# Patient Record
Sex: Female | Born: 1950
Health system: Southern US, Community
[De-identification: ages and names within clinical notes are randomized; demographics above are authoritative.]

## PROBLEM LIST (undated history)

## (undated) DIAGNOSIS — IMO0002 Reserved for concepts with insufficient information to code with codable children: Secondary | ICD-10-CM

## (undated) DIAGNOSIS — K5720 Diverticulitis of large intestine with perforation and abscess without bleeding: Secondary | ICD-10-CM

## (undated) HISTORY — PX: ELBOW SURGERY: SHX618

## (undated) HISTORY — PX: FOOT SURGERY: SHX648

## (undated) HISTORY — DX: Reserved for concepts with insufficient information to code with codable children: IMO0002

## (undated) HISTORY — PX: KNEE SURGERY: SHX244

## (undated) HISTORY — PX: APPENDECTOMY: SHX54

---

## 1999-05-18 ENCOUNTER — Other Ambulatory Visit: Admission: RE | Admit: 1999-05-18 | Discharge: 1999-05-18 | Payer: Self-pay | Admitting: Gynecology

## 1999-06-10 ENCOUNTER — Emergency Department (HOSPITAL_COMMUNITY): Admission: EM | Admit: 1999-06-10 | Discharge: 1999-06-10 | Payer: Self-pay

## 1999-06-10 ENCOUNTER — Encounter: Payer: Self-pay | Admitting: Surgery

## 2000-04-03 ENCOUNTER — Encounter: Admission: RE | Admit: 2000-04-03 | Discharge: 2000-04-03 | Payer: Self-pay | Admitting: Gynecology

## 2000-04-03 ENCOUNTER — Encounter: Payer: Self-pay | Admitting: Gynecology

## 2000-05-03 ENCOUNTER — Other Ambulatory Visit: Admission: RE | Admit: 2000-05-03 | Discharge: 2000-05-03 | Payer: Self-pay | Admitting: Gynecology

## 2001-04-10 ENCOUNTER — Other Ambulatory Visit: Admission: RE | Admit: 2001-04-10 | Discharge: 2001-04-10 | Payer: Self-pay | Admitting: Gynecology

## 2002-04-25 ENCOUNTER — Other Ambulatory Visit: Admission: RE | Admit: 2002-04-25 | Discharge: 2002-04-25 | Payer: Self-pay | Admitting: Gynecology

## 2003-02-12 ENCOUNTER — Encounter: Payer: Self-pay | Admitting: Gynecology

## 2003-02-12 ENCOUNTER — Ambulatory Visit (HOSPITAL_COMMUNITY): Admission: RE | Admit: 2003-02-12 | Discharge: 2003-02-12 | Payer: Self-pay | Admitting: Gynecology

## 2003-04-29 ENCOUNTER — Other Ambulatory Visit: Admission: RE | Admit: 2003-04-29 | Discharge: 2003-04-29 | Payer: Self-pay | Admitting: Gynecology

## 2003-10-22 ENCOUNTER — Ambulatory Visit (HOSPITAL_COMMUNITY): Admission: RE | Admit: 2003-10-22 | Discharge: 2003-10-22 | Payer: Self-pay | Admitting: Plastic Surgery

## 2003-10-22 ENCOUNTER — Ambulatory Visit (HOSPITAL_BASED_OUTPATIENT_CLINIC_OR_DEPARTMENT_OTHER): Admission: RE | Admit: 2003-10-22 | Discharge: 2003-10-22 | Payer: Self-pay | Admitting: Plastic Surgery

## 2003-10-22 ENCOUNTER — Encounter (INDEPENDENT_AMBULATORY_CARE_PROVIDER_SITE_OTHER): Payer: Self-pay | Admitting: Specialist

## 2004-06-22 ENCOUNTER — Other Ambulatory Visit: Admission: RE | Admit: 2004-06-22 | Discharge: 2004-06-22 | Payer: Self-pay | Admitting: Gynecology

## 2005-07-19 ENCOUNTER — Other Ambulatory Visit: Admission: RE | Admit: 2005-07-19 | Discharge: 2005-07-19 | Payer: Self-pay | Admitting: Gynecology

## 2006-06-08 ENCOUNTER — Encounter: Admission: RE | Admit: 2006-06-08 | Discharge: 2006-06-08 | Payer: Self-pay | Admitting: Otolaryngology

## 2006-07-19 ENCOUNTER — Ambulatory Visit (HOSPITAL_COMMUNITY): Admission: RE | Admit: 2006-07-19 | Discharge: 2006-07-19 | Payer: Self-pay | Admitting: Gynecology

## 2006-07-26 ENCOUNTER — Other Ambulatory Visit: Admission: RE | Admit: 2006-07-26 | Discharge: 2006-07-26 | Payer: Self-pay | Admitting: Gynecology

## 2007-07-26 ENCOUNTER — Ambulatory Visit (HOSPITAL_COMMUNITY): Admission: RE | Admit: 2007-07-26 | Discharge: 2007-07-26 | Payer: Self-pay | Admitting: Gynecology

## 2008-07-29 ENCOUNTER — Ambulatory Visit (HOSPITAL_COMMUNITY): Admission: RE | Admit: 2008-07-29 | Discharge: 2008-07-29 | Payer: Self-pay | Admitting: Gynecology

## 2009-07-30 ENCOUNTER — Encounter: Admission: RE | Admit: 2009-07-30 | Discharge: 2009-07-30 | Payer: Self-pay | Admitting: Gynecology

## 2011-02-01 ENCOUNTER — Other Ambulatory Visit (HOSPITAL_COMMUNITY): Payer: Self-pay | Admitting: Gynecology

## 2011-02-01 DIAGNOSIS — Z1231 Encounter for screening mammogram for malignant neoplasm of breast: Secondary | ICD-10-CM

## 2011-02-02 ENCOUNTER — Other Ambulatory Visit: Payer: Self-pay | Admitting: Gynecology

## 2011-02-02 ENCOUNTER — Ambulatory Visit (HOSPITAL_COMMUNITY): Payer: Self-pay

## 2011-02-04 ENCOUNTER — Ambulatory Visit (HOSPITAL_COMMUNITY)
Admission: RE | Admit: 2011-02-04 | Discharge: 2011-02-04 | Disposition: A | Discharge status: Home / Self Care | Payer: BC Managed Care – PPO | Source: Ambulatory Visit | Attending: Gynecology | Admitting: Gynecology

## 2011-02-04 DIAGNOSIS — Z1231 Encounter for screening mammogram for malignant neoplasm of breast: Secondary | ICD-10-CM | POA: Insufficient documentation

## 2012-03-26 ENCOUNTER — Other Ambulatory Visit (HOSPITAL_COMMUNITY): Payer: Self-pay | Admitting: Gynecology

## 2012-03-26 DIAGNOSIS — Z1231 Encounter for screening mammogram for malignant neoplasm of breast: Secondary | ICD-10-CM

## 2012-04-02 ENCOUNTER — Other Ambulatory Visit: Payer: Self-pay | Admitting: Gynecology

## 2012-04-16 ENCOUNTER — Ambulatory Visit (HOSPITAL_COMMUNITY)
Admission: RE | Admit: 2012-04-16 | Discharge: 2012-04-16 | Disposition: A | Discharge status: Home / Self Care | Payer: BC Managed Care – PPO | Source: Ambulatory Visit | Attending: Gynecology | Admitting: Gynecology

## 2012-04-16 DIAGNOSIS — Z1231 Encounter for screening mammogram for malignant neoplasm of breast: Secondary | ICD-10-CM | POA: Insufficient documentation

## 2013-01-25 ENCOUNTER — Other Ambulatory Visit: Payer: Self-pay | Admitting: Neurosurgery

## 2013-01-25 DIAGNOSIS — M5412 Radiculopathy, cervical region: Secondary | ICD-10-CM

## 2013-01-25 DIAGNOSIS — M5030 Other cervical disc degeneration, unspecified cervical region: Secondary | ICD-10-CM

## 2013-01-25 DIAGNOSIS — M4802 Spinal stenosis, cervical region: Secondary | ICD-10-CM

## 2013-01-31 ENCOUNTER — Ambulatory Visit
Admission: RE | Admit: 2013-01-31 | Discharge: 2013-01-31 | Disposition: A | Discharge status: Home / Self Care | Payer: BC Managed Care – PPO | Source: Ambulatory Visit | Attending: Neurosurgery | Admitting: Neurosurgery

## 2013-01-31 DIAGNOSIS — M4802 Spinal stenosis, cervical region: Secondary | ICD-10-CM

## 2013-01-31 DIAGNOSIS — M5412 Radiculopathy, cervical region: Secondary | ICD-10-CM

## 2013-01-31 DIAGNOSIS — M5030 Other cervical disc degeneration, unspecified cervical region: Secondary | ICD-10-CM

## 2013-05-14 ENCOUNTER — Ambulatory Visit: Payer: Self-pay | Admitting: Gynecology

## 2013-05-17 ENCOUNTER — Ambulatory Visit: Payer: Self-pay | Admitting: Gynecology

## 2013-05-30 ENCOUNTER — Encounter: Payer: Self-pay | Admitting: Gynecology

## 2013-05-30 ENCOUNTER — Other Ambulatory Visit (HOSPITAL_COMMUNITY)
Admission: RE | Admit: 2013-05-30 | Discharge: 2013-05-30 | Disposition: A | Discharge status: Home / Self Care | Payer: BC Managed Care – PPO | Source: Ambulatory Visit | Attending: Gynecology | Admitting: Gynecology

## 2013-05-30 ENCOUNTER — Ambulatory Visit (INDEPENDENT_AMBULATORY_CARE_PROVIDER_SITE_OTHER): Payer: BC Managed Care – PPO | Admitting: Gynecology

## 2013-05-30 VITALS — BP 130/88 | Ht 66.5 in | Wt 150.0 lb

## 2013-05-30 DIAGNOSIS — Z1159 Encounter for screening for other viral diseases: Secondary | ICD-10-CM

## 2013-05-30 DIAGNOSIS — Z780 Asymptomatic menopausal state: Secondary | ICD-10-CM | POA: Insufficient documentation

## 2013-05-30 DIAGNOSIS — Z1151 Encounter for screening for human papillomavirus (HPV): Secondary | ICD-10-CM | POA: Insufficient documentation

## 2013-05-30 DIAGNOSIS — Z01419 Encounter for gynecological examination (general) (routine) without abnormal findings: Secondary | ICD-10-CM

## 2013-05-30 DIAGNOSIS — R635 Abnormal weight gain: Secondary | ICD-10-CM

## 2013-05-30 DIAGNOSIS — N951 Menopausal and female climacteric states: Secondary | ICD-10-CM

## 2013-05-30 DIAGNOSIS — M2550 Pain in unspecified joint: Secondary | ICD-10-CM | POA: Insufficient documentation

## 2013-05-30 DIAGNOSIS — Z8639 Personal history of other endocrine, nutritional and metabolic disease: Secondary | ICD-10-CM | POA: Insufficient documentation

## 2013-05-30 LAB — LIPID PANEL
LDL Cholesterol: 116 mg/dL — ABNORMAL HIGH (ref 0–99)
Total CHOL/HDL Ratio: 2.8 Ratio
Triglycerides: 80 mg/dL (ref <150)

## 2013-05-30 LAB — CBC WITH DIFFERENTIAL/PLATELET
HCT: 41.8 % (ref 36.0–46.0)
Lymphocytes Relative: 28 % (ref 12–46)
MCH: 30 pg (ref 26.0–34.0)
MCV: 87.1 fL (ref 78.0–100.0)
Monocytes Absolute: 0.4 10*3/uL (ref 0.1–1.0)
Monocytes Relative: 7 % (ref 3–12)
Neutro Abs: 3.2 10*3/uL (ref 1.7–7.7)
Platelets: 305 10*3/uL (ref 150–400)

## 2013-05-30 LAB — COMPREHENSIVE METABOLIC PANEL
ALT: 15 U/L (ref 0–35)
Albumin: 4.7 g/dL (ref 3.5–5.2)
BUN: 17 mg/dL (ref 6–23)
Calcium: 9.7 mg/dL (ref 8.4–10.5)
Creat: 0.93 mg/dL (ref 0.50–1.10)
Glucose, Bld: 95 mg/dL (ref 70–99)
Total Bilirubin: 0.7 mg/dL (ref 0.3–1.2)
Total Protein: 7.2 g/dL (ref 6.0–8.3)

## 2013-05-30 LAB — HEPATITIS C ANTIBODY: HCV Ab: NEGATIVE

## 2013-05-30 NOTE — Progress Notes (Signed)
Katherine Hopkins November 24, 1950 [id number]   History:    62 y.o.  for annual gyn exam new patient to the practice who was previously being followed by Dr. Nicholas Lose. Patient's only complaint has been joint pains on and off. She does have strong family history of rheumatoid arthritis. Patient denies any prior history of abnormal Pap smears. Her last mammogram was normal July 2013 although dense breast. She has had history of vitamin D deficiency in the past. Her last bone density study was in 2013 and was normal. Her Tdap vaccine is up-to-date. She has not received shingles vaccine. Patient with history of colon polyps several years ago her last colonoscopy was in 2006. She is on no hormone replacement therapy.  Past medical history,surgical history, family history and social history were all reviewed and documented in the EPIC chart.  Gynecologic History No LMP recorded. Patient is postmenopausal. Contraception: post menopausal status Last Pap: 2013. Results were: normal Last mammogram: 2013. Results were: normal but dense  Obstetric History OB History  Gravida Para Term Preterm AB SAB TAB Ectopic Multiple Living  2 2        2     # Outcome Date GA Lbr Len/2nd Weight Sex Delivery Anes PTL Lv  2 PAR           1 PAR                ROS: A ROS was performed and pertinent positives and negatives are included in the history.  GENERAL: No fevers or chills. HEENT: No change in vision, no earache, sore throat or sinus congestion. NECK: No pain or stiffness. CARDIOVASCULAR: No chest pain or pressure. No palpitations. PULMONARY: No shortness of breath, cough or wheeze. GASTROINTESTINAL: No abdominal pain, nausea, vomiting or diarrhea, melena or bright red blood per rectum. GENITOURINARY: No urinary frequency, urgency, hesitancy or dysuria. MUSCULOSKELETAL: No joint or muscle pain, no back pain, no recent trauma. DERMATOLOGIC: No rash, no itching, no lesions. ENDOCRINE: No polyuria, polydipsia, no heat or cold  intolerance. No recent change in weight. HEMATOLOGICAL: No anemia or easy bruising or bleeding. NEUROLOGIC: No headache, seizures, numbness, tingling or weakness. PSYCHIATRIC: No depression, no loss of interest in normal activity or change in sleep pattern.     Exam: chaperone present  BP 130/88  Ht 5' 6.5" (1.689 m)  Wt 150 lb (68.04 kg)  BMI 23.85 kg/m2  Body mass index is 23.85 kg/(m^2).  General appearance : Well developed well nourished female. No acute distress HEENT: Neck supple, trachea midline, no carotid bruits, no thyroidmegaly Lungs: Clear to auscultation, no rhonchi or wheezes, or rib retractions  Heart: Regular rate and rhythm, no murmurs or gallops Breast:Examined in sitting and supine position were symmetrical in appearance, no palpable masses or tenderness,  no skin retraction, no nipple inversion, no nipple discharge, no skin discoloration, no axillary or supraclavicular lymphadenopathy Abdomen: no palpable masses or tenderness, no rebound or guarding Extremities: no edema or skin discoloration or tenderness  Pelvic:  Bartholin, Urethra, Skene Glands: Within normal limits             Vagina: No gross lesions or discharge, atrophic changes  Cervix: No gross lesions or discharge  Uterus  anteverted, normal size, shape and consistency, non-tender and mobile  Adnexa  Without masses or tenderness  Anus and perineum  normal   Rectovaginal  normal sphincter tone without palpated masses or tenderness             Hemoccult colonoscopy this  year     Assessment/Plan:  62 y.o. female for annual exam who was scheduled for colonoscopy the next 2 weeks. She was reminded of the importance of monthly breast exam. Because of her joint pains and family history of rheumatoid arthritis she will have a C-reactive protein drawn today along with her comprehensive metabolic panel, TSH, fasting lipid profile, CBC and vitamin D level. Pap smear was done today. Prescription for shingles  Vaccine was provided. Urinalysis is ordered. She will need a bone density study next year.  New CDC guidelines is recommending patients be tested once in her lifetime for hepatitis C antibody who were born between [birth year] through 1963. This was discussed with the patient today and has agreed to be tested today.  Patient reminded to schedule her mammogram but to request a 3-D due to her dense breast.    Ok Edwards MD, 9:17 AM 05/30/2013

## 2013-05-30 NOTE — Patient Instructions (Signed)
Shingles Vaccine What You Need to Know WHAT IS SHINGLES?  Shingles is a painful skin rash, often with blisters. It is also called Herpes Zoster or just Zoster.  A shingles rash usually appears on one side of the face or body and lasts from 2 to 4 weeks. Its main symptom is pain, which can be quite severe. Other symptoms of shingles can include fever, headache, chills, and upset stomach. Very rarely, a shingles infection can lead to pneumonia, hearing problems, blindness, brain inflammation (encephalitis), or death.  For about 1 person in 5, severe pain can continue even after the rash clears up. This is called post-herpetic neuralgia.  Shingles is caused by the Varicella Zoster virus. This is the same virus that causes chickenpox. Only someone who has had a case of chickenpox or rarely, has gotten chickenpox vaccine, can get shingles. The virus stays in your body. It can reappear many years later to cause a case of shingles.  You cannot catch shingles from another person with shingles. However, a person who has never had chickenpox (or chickenpox vaccine) could get chickenpox from someone with shingles. This is not very common.  Shingles is far more common in people 50 and older than in younger people. It is also more common in people whose immune systems are weakened because of a disease such as cancer or drugs such as steroids or chemotherapy.  At least 1 million people get shingles per year in the [place]. SHINGLES VACCINE  A vaccine for shingles was licensed in 2006. In clinical trials, the vaccine reduced the risk of shingles by 50%. It can also reduce the pain in people who still get shingles after being vaccinated.  A single dose of shingles vaccine is recommended for adults 60 years of age and older. SOME PEOPLE SHOULD NOT GET SHINGLES VACCINE OR SHOULD WAIT A person should not get shingles vaccine if he or she:  Has ever had a life-threatening allergic reaction to gelatin, the  antibiotic neomycin, or any other component of shingles vaccine. Tell your caregiver if you have any severe allergies.  Has a weakened immune system because of current:  AIDS or another disease that affects the immune system.  Treatment with drugs that affect the immune system, such as prolonged use of high-dose steroids.  Cancer treatment, such as radiation or chemotherapy.  Cancer affecting the bone marrow or lymphatic system, such as leukemia or lymphoma.  Is pregnant, or might be pregnant. Women should not become pregnant until at least 4 weeks after getting shingles vaccine. Someone with a minor illness, such as a cold, may be vaccinated. Anyone with a moderate or severe acute illness should usually wait until he or she recovers before getting the vaccine. This includes anyone with a temperature of 101.3 F (38 C) or higher. WHAT ARE THE RISKS FROM SHINGLES VACCINE?  A vaccine, like any medicine, could possibly cause serious problems, such as severe allergic reactions. However, the risk of a vaccine causing serious harm, or death, is extremely small.  No serious problems have been identified with shingles vaccine. Mild Problems  Redness, soreness, swelling, or itching at the site of the injection (about 1 person in 3).  Headache (about 1 person in 70). Like all vaccines, shingles vaccine is being closely monitored for unusual or severe problems. WHAT IF THERE IS A MODERATE OR SEVERE REACTION? What should I look for? Any unusual condition, such as a severe allergic reaction or a high fever. If a severe allergic reaction   occurred, it would be within a few minutes to an hour after the shot. Signs of a serious allergic reaction can include difficulty breathing, weakness, hoarseness or wheezing, a fast heartbeat, hives, dizziness, paleness, or swelling of the throat. What should I do?  Call your caregiver, or get the person to a caregiver right away.  Tell the caregiver what  happened, the date and time it happened, and when the vaccination was given.  Ask the caregiver to report the reaction by filing a Vaccine Adverse Event Reporting System (VAERS) form. Or, you can file this report through the VAERS web site at www.vaers.LAgents.no or by calling 1-408-623-4780. VAERS does not provide medical advice. HOW CAN I LEARN MORE?  Ask your caregiver. He or she can give you the vaccine package insert or suggest other sources of information.  Contact the Centers for Disease Control and Prevention (CDC):  Call 782-794-7958 (1-800-CDC-INFO).  Visit the CDC website at PicCapture.uy CDC Shingles Vaccine VIS (07/08/08) Document Released: 07/17/2006 Document Revised: 12/12/2011 Document Reviewed: 07/08/2008 ExitCare Patient Information 2014 [place], [place]. Tetanus, Diphtheria, Pertussis (Tdap) Vaccine What You Need to Know WHY GET VACCINATED? Tetanus, diphtheria and pertussis can be very serious diseases, even for adolescents and adults. Tdap vaccine can protect [place] from these diseases. TETANUS (Lockjaw) causes painful muscle tightening and stiffness, usually all over the body.  It can lead to tightening of muscles in the head and neck so you can't open your mouth, swallow, or sometimes even breathe. Tetanus kills about 1 out of 5 people who are infected. DIPHTHERIA can cause a thick coating to form in the back of the throat.  It can lead to breathing problems, paralysis, heart failure, and death. PERTUSSIS (Whooping Cough) causes severe coughing spells, which can cause difficulty breathing, vomiting and disturbed sleep.  It can also lead to weight loss, incontinence, and rib fractures. Up to 2 in 100 adolescents and 5 in 100 adults with pertussis are hospitalized or have complications, which could include pneumonia and death. These diseases are caused by bacteria. Diphtheria and pertussis are spread from person to person through coughing or sneezing. Tetanus enters  the body through cuts, scratches, or wounds. Before vaccines, the [place] saw as many as 200,000 cases a year of diphtheria and pertussis, and hundreds of cases of tetanus. Since vaccination began, tetanus and diphtheria have dropped by about 99% and pertussis by about 80%. TDAP VACCINE Tdap vaccine can protect adolescents and adults from tetanus, diphtheria, and pertussis. One dose of Tdap is routinely given at age 36 or 5. People who did not get Tdap at that age should get it as soon as possible. Tdap is especially important for health care professionals and anyone having close contact with a baby younger than 12 months. Pregnant women should get a dose of Tdap during every pregnancy, to protect the newborn from pertussis. Infants are most at risk for severe, life-threatening complications from pertussis. A similar vaccine, called Td, protects from tetanus and diphtheria, but not pertussis. A Td booster should be given every 10 years. Tdap may be given as one of these boosters if you have not already gotten a dose. Tdap may also be given after a severe cut or burn to prevent tetanus infection. Your doctor can give you more information. Tdap may safely be given at the same time as other vaccines. SOME PEOPLE SHOULD NOT GET THIS VACCINE  If you ever had a life-threatening allergic reaction after a dose of any tetanus, diphtheria, or pertussis containing  vaccine, OR if you have a severe allergy to any part of this vaccine, you should not get Tdap. Tell your doctor if you have any severe allergies.  If you had a coma, or long or multiple seizures within 7 days after a childhood dose of DTP or DTaP, you should not get Tdap, unless a cause other than the vaccine was found. You can still get Td.  Talk to your doctor if you:  have epilepsy or another nervous system problem,  had severe pain or swelling after any vaccine containing diphtheria, tetanus or pertussis,  ever had Guillain-Barr  Syndrome (GBS),  aren't feeling well on the day the shot is scheduled. RISKS OF A VACCINE REACTION With any medicine, including vaccines, there is a chance of side effects. These are usually mild and go away on their own, but serious reactions are also possible. Brief fainting spells can follow a vaccination, leading to injuries from falling. Sitting or lying down for about 15 minutes can help prevent these. Tell your doctor if you feel dizzy or light-headed, or have vision changes or ringing in the ears. Mild problems following Tdap (Did not interfere with activities)  Pain where the shot was given (about 3 in 4 adolescents or 2 in 3 adults)  Redness or swelling where the shot was given (about 1 person in 5)  Mild fever of at least 100.68F (up to about 1 in 25 adolescents or 1 in 100 adults)  Headache (about 3 or 4 people in 10)  Tiredness (about 1 person in 3 or 4)  Nausea, vomiting, diarrhea, stomach ache (up to 1 in 4 adolescents or 1 in 10 adults)  Chills, body aches, sore joints, rash, swollen glands (uncommon) Moderate problems following Tdap (Interfered with activities, but did not require medical attention)  Pain where the shot was given (about 1 in 5 adolescents or 1 in 100 adults)  Redness or swelling where the shot was given (up to about 1 in 16 adolescents or 1 in 25 adults)  Fever over 102F (about 1 in 100 adolescents or 1 in 250 adults)  Headache (about 3 in 20 adolescents or 1 in 10 adults)  Nausea, vomiting, diarrhea, stomach ache (up to 1 or 3 people in 100)  Swelling of the entire arm where the shot was given (up to about 3 in 100). Severe problems following Tdap (Unable to perform usual activities, required medical attention)  Swelling, severe pain, bleeding and redness in the arm where the shot was given (rare). A severe allergic reaction could occur after any vaccine (estimated less than 1 in a million doses). WHAT IF THERE IS A SERIOUS REACTION? What  should I look for?  Look for anything that concerns you, such as signs of a severe allergic reaction, very high fever, or behavior changes. Signs of a severe allergic reaction can include hives, swelling of the face and throat, difficulty breathing, a fast heartbeat, dizziness, and weakness. These would start a few minutes to a few hours after the vaccination. What should I do?  If you think it is a severe allergic reaction or other emergency that can't wait, call 9-1-1 or get the person to the nearest hospital. Otherwise, call your doctor.  Afterward, the reaction should be reported to the "Vaccine Adverse Event Reporting System" (VAERS). Your doctor might file this report, or you can do it yourself through the VAERS web site at www.vaers.SamedayNews.es, or by calling 913-238-6089. VAERS is only for reporting reactions. They do not give  medical advice.  THE NATIONAL VACCINE INJURY COMPENSATION PROGRAM The National Vaccine Injury Compensation Program (VICP) is a federal program that was created to compensate people who may have been injured by certain vaccines. Persons who believe they may have been injured by a vaccine can learn about the program and about filing a claim by calling 1-7698801530 or visiting the VICP website at SpiritualWord.at. HOW CAN I LEARN MORE?  Ask your doctor.  Call your local or state health department.  Contact the Centers for Disease Control and Prevention (CDC):  Call 307 326 4240 or visit CDC's website at PicCapture.uy. CDC Tdap Vaccine VIS (02/09/12) Document Released: 03/20/2012 Document Revised: 06/13/2012 Document Reviewed: 03/20/2012 ExitCare Patient Information 2014 [place], [place].

## 2013-05-30 NOTE — Addendum Note (Signed)
Addended by: Ok Edwards on: 05/30/2013 09:59 AM   Modules accepted: Orders

## 2013-05-30 NOTE — Addendum Note (Signed)
Addended by: Bertram Savin A on: 05/30/2013 09:36 AM   Modules accepted: Orders

## 2013-05-31 LAB — URINALYSIS W MICROSCOPIC + REFLEX CULTURE
Casts: NONE SEEN
Crystals: NONE SEEN
Ketones, ur: NEGATIVE mg/dL
Leukocytes, UA: NEGATIVE
Protein, ur: NEGATIVE mg/dL
Specific Gravity, Urine: 1.008 (ref 1.005–1.030)
Urobilinogen, UA: 0.2 mg/dL (ref 0.0–1.0)

## 2013-05-31 LAB — VITAMIN D 25 HYDROXY (VIT D DEFICIENCY, FRACTURES): Vit D, 25-Hydroxy: 59 ng/mL (ref 30–89)

## 2013-05-31 LAB — HOMOCYSTEINE: Homocysteine: 14.1 umol/L (ref 4.0–15.4)

## 2013-06-04 ENCOUNTER — Other Ambulatory Visit: Payer: Self-pay | Admitting: Gynecology

## 2013-06-04 DIAGNOSIS — Z1231 Encounter for screening mammogram for malignant neoplasm of breast: Secondary | ICD-10-CM

## 2013-06-05 ENCOUNTER — Encounter: Payer: Self-pay | Admitting: Gynecology

## 2013-06-05 ENCOUNTER — Ambulatory Visit (HOSPITAL_COMMUNITY)
Admission: RE | Admit: 2013-06-05 | Discharge: 2013-06-05 | Disposition: A | Discharge status: Home / Self Care | Payer: BC Managed Care – PPO | Source: Ambulatory Visit | Attending: Gynecology | Admitting: Gynecology

## 2013-06-05 DIAGNOSIS — Z1231 Encounter for screening mammogram for malignant neoplasm of breast: Secondary | ICD-10-CM | POA: Insufficient documentation

## 2013-06-12 ENCOUNTER — Encounter: Payer: Self-pay | Admitting: Gynecology

## 2013-06-24 ENCOUNTER — Encounter: Payer: Self-pay | Admitting: Gynecology

## 2013-08-08 ENCOUNTER — Other Ambulatory Visit: Payer: Self-pay

## 2013-11-07 ENCOUNTER — Other Ambulatory Visit: Payer: Self-pay | Admitting: Dermatology

## 2014-03-31 ENCOUNTER — Encounter: Payer: Self-pay | Admitting: Gynecology

## 2014-05-01 ENCOUNTER — Other Ambulatory Visit: Payer: Self-pay | Admitting: Dermatology

## 2014-05-14 ENCOUNTER — Telehealth: Payer: Self-pay | Admitting: Family Medicine

## 2014-05-14 NOTE — Telephone Encounter (Signed)
Pt would like Dr Sherren Mocha to call her. She states is a personal friend and she needs some advice. Probably who he recommends for her husband who was a swords pt at another location. Refused new doc. [phone]  Ext 7

## 2014-05-15 ENCOUNTER — Other Ambulatory Visit: Payer: Self-pay | Admitting: Gynecology

## 2014-05-15 DIAGNOSIS — Z1231 Encounter for screening mammogram for malignant neoplasm of breast: Secondary | ICD-10-CM

## 2014-05-15 NOTE — Telephone Encounter (Signed)
I spoke with Mrs Kurtenbach and she would like for her husband to be treated by a physician that has been in the system for a while.  I offered Dr Sherren Mocha but she does not want him because of retirement.  Dr Sarajane Jews, Dr Raliegh Ip, and Dr Elease Hashimoto are not taking new patients.  I offered Dr Yong Channel but she does not want him.  She is willing to take her husband to another office.  Please advise.

## 2014-06-04 ENCOUNTER — Ambulatory Visit (INDEPENDENT_AMBULATORY_CARE_PROVIDER_SITE_OTHER): Payer: BC Managed Care – PPO | Admitting: Gynecology

## 2014-06-04 ENCOUNTER — Encounter: Payer: Self-pay | Admitting: Gynecology

## 2014-06-04 VITALS — BP 130/84 | Ht 65.0 in | Wt 151.0 lb

## 2014-06-04 DIAGNOSIS — Z01419 Encounter for gynecological examination (general) (routine) without abnormal findings: Secondary | ICD-10-CM

## 2014-06-04 DIAGNOSIS — N951 Menopausal and female climacteric states: Secondary | ICD-10-CM

## 2014-06-04 DIAGNOSIS — Z8639 Personal history of other endocrine, nutritional and metabolic disease: Secondary | ICD-10-CM

## 2014-06-04 DIAGNOSIS — R252 Cramp and spasm: Secondary | ICD-10-CM

## 2014-06-04 DIAGNOSIS — Z780 Asymptomatic menopausal state: Secondary | ICD-10-CM

## 2014-06-04 DIAGNOSIS — Z8349 Family history of other endocrine, nutritional and metabolic diseases: Secondary | ICD-10-CM

## 2014-06-04 LAB — CBC WITH DIFFERENTIAL/PLATELET
Basophils Absolute: 0 10*3/uL (ref 0.0–0.1)
Basophils Relative: 0 % (ref 0–1)
Eosinophils Absolute: 0 10*3/uL (ref 0.0–0.7)
Eosinophils Relative: 0 % (ref 0–5)
HEMATOCRIT: 41.2 % (ref 36.0–46.0)
HEMOGLOBIN: 14.2 g/dL (ref 12.0–15.0)
LYMPHS PCT: 16 % (ref 12–46)
Lymphs Abs: 1.5 10*3/uL (ref 0.7–4.0)
MCH: 29.5 pg (ref 26.0–34.0)
MCHC: 34.5 g/dL (ref 30.0–36.0)
MCV: 85.7 fL (ref 78.0–100.0)
Monocytes Absolute: 0.5 10*3/uL (ref 0.1–1.0)
Monocytes Relative: 5 % (ref 3–12)
NEUTROS ABS: 7.2 10*3/uL (ref 1.7–7.7)
NEUTROS PCT: 79 % — AB (ref 43–77)
PLATELETS: 291 10*3/uL (ref 150–400)
RBC: 4.81 MIL/uL (ref 3.87–5.11)
RDW: 14.3 % (ref 11.5–15.5)
WBC: 9.1 10*3/uL (ref 4.0–10.5)

## 2014-06-04 LAB — COMPREHENSIVE METABOLIC PANEL
ALBUMIN: 4.7 g/dL (ref 3.5–5.2)
ALT: 16 U/L (ref 0–35)
AST: 23 U/L (ref 0–37)
Alkaline Phosphatase: 59 U/L (ref 39–117)
BUN: 17 mg/dL (ref 6–23)
CALCIUM: 9.6 mg/dL (ref 8.4–10.5)
CO2: 28 mEq/L (ref 19–32)
CREATININE: 0.9 mg/dL (ref 0.50–1.10)
Chloride: 101 mEq/L (ref 96–112)
Glucose, Bld: 95 mg/dL (ref 70–99)
POTASSIUM: 5.1 meq/L (ref 3.5–5.3)
SODIUM: 139 meq/L (ref 135–145)
Total Bilirubin: 0.8 mg/dL (ref 0.2–1.2)
Total Protein: 7.2 g/dL (ref 6.0–8.3)

## 2014-06-04 LAB — LIPID PANEL
CHOL/HDL RATIO: 2.5 ratio
CHOLESTEROL: 184 mg/dL (ref 0–200)
HDL: 73 mg/dL (ref >39)
LDL CALC: 101 mg/dL — AB (ref 0–99)
TRIGLYCERIDES: 48 mg/dL (ref <150)
VLDL: 10 mg/dL (ref 0–40)

## 2014-06-04 LAB — TSH: TSH: 1.739 u[IU]/mL (ref 0.350–4.500)

## 2014-06-04 NOTE — Progress Notes (Signed)
Katherine Hopkins Jun 12, 1951 [id number]   History:    63 y.o.  for annual gyn exam with only complaints are at times she has leg cramps while in bed. This occurs maybe once a month. Patient was seen last year for the first time as a new patient since she was previously been followed by Dr. Ubaldo Glassing. Patient denies any prior history of abnormal Pap smears. Patient with past history of colon polyps. Last colonoscopy 2014 according to patient it was normal. Her mammogram is due next week. Patient does have history of dense breast. The patient is on oral HRT. Patient has no PCP. Patient with strong family history of thyroid disease and rheumatoid arthritis. Patient has had in the past history vitamin D deficiency.  Past medical history,surgical history, family history and social history were all reviewed and documented in the EPIC chart.  Gynecologic History No LMP recorded. Patient is postmenopausal. Contraception: post menopausal status Last Pap: 2014. Results were: normal Last mammogram: 2014. Results were: normal  Obstetric History OB History  Gravida Para Term Preterm AB SAB TAB Ectopic Multiple Living  2 2        2     # Outcome Date GA Lbr Len/2nd Weight Sex Delivery Anes PTL Lv  2 PAR           1 PAR                ROS: A ROS was performed and pertinent positives and negatives are included in the history.  GENERAL: No fevers or chills. HEENT: No change in vision, no earache, sore throat or sinus congestion. NECK: No pain or stiffness. CARDIOVASCULAR: No chest pain or pressure. No palpitations. PULMONARY: No shortness of breath, cough or wheeze. GASTROINTESTINAL: No abdominal pain, nausea, vomiting or diarrhea, melena or bright red blood per rectum. GENITOURINARY: No urinary frequency, urgency, hesitancy or dysuria. MUSCULOSKELETAL: No joint or muscle pain, no back pain, no recent trauma. DERMATOLOGIC: No rash, no itching, no lesions. ENDOCRINE: No polyuria, polydipsia, no heat or cold  intolerance. No recent change in weight. HEMATOLOGICAL: No anemia or easy bruising or bleeding. NEUROLOGIC: No headache, seizures, numbness, tingling or weakness. PSYCHIATRIC: No depression, no loss of interest in normal activity or change in sleep pattern.     Exam: chaperone present  BP 130/84  Ht 5\' 5"  (1.651 m)  Wt 151 lb (68.493 kg)  BMI 25.13 kg/m2  Body mass index is 25.13 kg/(m^2).  General appearance : Well developed well nourished female. No acute distress HEENT: Neck supple, trachea midline, no carotid bruits, no thyroidmegaly Lungs: Clear to auscultation, no rhonchi or wheezes, or rib retractions  Heart: Regular rate and rhythm, no murmurs or gallops Breast:Examined in sitting and supine position were symmetrical in appearance, no palpable masses or tenderness,  no skin retraction, no nipple inversion, no nipple discharge, no skin discoloration, no axillary or supraclavicular lymphadenopathy Abdomen: no palpable masses or tenderness, no rebound or guarding Extremities: no edema or skin discoloration or tenderness  Pelvic:  Bartholin, Urethra, Skene Glands: Within normal limits             Vagina: No gross lesions or discharge, atrophic changes  Cervix: No gross lesions or discharge  Uterus  anteverted, normal size, shape and consistency, non-tender and mobile  Adnexa  Without masses or tenderness  Anus and perineum  normal   Rectovaginal  normal sphincter tone without palpated masses or tenderness  Hemoccult colonoscopy recently     Assessment/Plan:  63 y.o. female for annual exam will have the following labs drawn today: CBC, comprehensive metabolic panel, fasting lipid profile, TSH, and urinalysis, and vitamin D level. Pap smear not done. Patient to have her mammogram this week. A prescription was provided for her to obtain her shingles vaccine at her local pharmacy. Patient will schedule her bone density study here in the office in the next few weeks. We  discussed importance of calcium vitamin D and regular exercise for osteoporosis prevention. We discussed importance of monthly breast exam. Patient declined flu vaccine.  Note: This dictation was prepared with  Dragon/digital dictation along withSmart phrase technology. Any transcriptional errors that result from this process are unintentional.   Terrance Mass MD, 8:36 AM 06/04/2014

## 2014-06-04 NOTE — Patient Instructions (Signed)
Bone Densitometry Bone densitometry is a special X-ray that measures your bone density and can be used to help predict your risk of bone fractures. This test is used to determine bone mineral content and density to diagnose osteoporosis. Osteoporosis is the loss of bone that may cause the bone to become weak. Osteoporosis commonly occurs in women entering menopause. However, it may be found in men and in people with other diseases. PREPARATION FOR TEST No preparation necessary. WHO SHOULD BE TESTED?  All women older than 65.  Postmenopausal women (50 to 65) with risk factors for osteoporosis.  People with a previous fracture caused by normal activities.  People with a small body frame (less than 127 poundsor a body mass index [BMI] of less than 21).  People who have a parent with a hip fracture or history of osteoporosis.  People who smoke.  People who have rheumatoid arthritis.  Anyone who engages in excessive alcohol use (more than 3 drinks most days).  Women who experience early menopause. WHEN SHOULD YOU BE RETESTED? Current guidelines suggest that you should wait at least 2 years before doing a bone density test again if your first test was normal.Recent studies indicated that women with normal bone density may be able to wait a few years before needing to repeat a bone density test. You should discuss this with your caregiver.  NORMAL FINDINGS   Normal: less than standard deviation below normal (greater than -1).  Osteopenia: 1 to 2.5 standard deviations below normal (-1 to -2.5).  Osteoporosis: greater than 2.5 standard deviations below normal (less than -2.5). Test results are reported as a "T score" and a "Z score."The T score is a number that compares your bone density with the bone density of healthy, young women.The Z score is a number that compares your bone density with the scores of women who are the same age, gender, and race.  Ranges for normal findings may vary  among different laboratories and hospitals. You should always check with your doctor after having lab work or other tests done to discuss the meaning of your test results and whether your values are considered within normal limits. MEANING OF TEST  Your caregiver will go over the test results with you and discuss the importance and meaning of your results, as well as treatment options and the need for additional tests if necessary. OBTAINING THE TEST RESULTS It is your responsibility to obtain your test results. Ask the lab or department performing the test when and how you will get your results. Document Released: 10/11/2004 Document Revised: 12/12/2011 Document Reviewed: 11/03/2010 Exit[employer] Patient Information 2015 [employer]. This information is not intended to replace advice given to you by your health care provider. Make sure you discuss any questions you have with your health care provider. Shingles Vaccine What You Need to Know WHAT IS SHINGLES?  Shingles is a painful skin rash, often with blisters. It is also called Herpes Zoster or just Zoster.  A shingles rash usually appears on one side of the face or body and lasts from 2 to 4 weeks. Its main symptom is pain, which can be quite severe. Other symptoms of shingles can include fever, headache, chills, and upset stomach. Very rarely, a shingles infection can lead to pneumonia, hearing problems, blindness, brain inflammation (encephalitis), or death.  For about 1 person in 5, severe pain can continue even after the rash clears up. This is called post-herpetic neuralgia.  Shingles is caused by the Varicella Zoster virus.   This is the same virus that causes chickenpox. Only someone who has had a case of chickenpox or rarely, has gotten chickenpox vaccine, can get shingles. The virus stays in your body. It can reappear many years later to cause a case of shingles.  You cannot catch shingles from another person with shingles. However, a  person who has never had chickenpox (or chickenpox vaccine) could get chickenpox from someone with shingles. This is not very common.  Shingles is far more common in people 50 and older than in younger people. It is also more common in people whose immune systems are weakened because of a disease such as cancer or drugs such as steroids or chemotherapy.  At least 1 million people get shingles per year in the [place]. SHINGLES VACCINE  A vaccine for shingles was licensed in 2006. In clinical trials, the vaccine reduced the risk of shingles by 50%. It can also reduce the pain in people who still get shingles after being vaccinated.  A single dose of shingles vaccine is recommended for adults 60 years of age and older. SOME PEOPLE SHOULD NOT GET SHINGLES VACCINE OR SHOULD WAIT A person should not get shingles vaccine if he or she:  Has ever had a life-threatening allergic reaction to gelatin, the antibiotic neomycin, or any other component of shingles vaccine. Tell your caregiver if you have any severe allergies.  Has a weakened immune system because of current:  AIDS or another disease that affects the immune system.  Treatment with drugs that affect the immune system, such as prolonged use of high-dose steroids.  Cancer treatment, such as radiation or chemotherapy.  Cancer affecting the bone marrow or lymphatic system, such as leukemia or lymphoma.  Is pregnant, or might be pregnant. Women should not become pregnant until at least 4 weeks after getting shingles vaccine. Someone with a minor illness, such as a cold, may be vaccinated. Anyone with a moderate or severe acute illness should usually wait until he or she recovers before getting the vaccine. This includes anyone with a temperature of 101.3 F (38 C) or higher. WHAT ARE THE RISKS FROM SHINGLES VACCINE?  A vaccine, like any medicine, could possibly cause serious problems, such as severe allergic reactions. However, the risk  of a vaccine causing serious harm, or death, is extremely small.  No serious problems have been identified with shingles vaccine. Mild Problems  Redness, soreness, swelling, or itching at the site of the injection (about 1 person in 3).  Headache (about 1 person in 70). Like all vaccines, shingles vaccine is being closely monitored for unusual or severe problems. WHAT IF THERE IS A MODERATE OR SEVERE REACTION? What should I look for? Any unusual condition, such as a severe allergic reaction or a high fever. If a severe allergic reaction occurred, it would be within a few minutes to an hour after the shot. Signs of a serious allergic reaction can include difficulty breathing, weakness, hoarseness or wheezing, a fast heartbeat, hives, dizziness, paleness, or swelling of the throat. What should I do?  Call your caregiver, or get the person to a caregiver right away.  Tell the caregiver what happened, the date and time it happened, and when the vaccination was given.  Ask the caregiver to report the reaction by filing a Vaccine Adverse Event Reporting System (VAERS) form. Or, you can file this report through the VAERS web site at www.vaers.hhs.gov or by calling 1-800-822-7967. VAERS does not provide medical advice. HOW CAN I LEARN   MORE?  Ask your caregiver. He or she can give you the vaccine package insert or suggest other sources of information.  Contact the Centers for Disease Control and Prevention (CDC):  Call 1-800-232-4636 (1-800-CDC-INFO).  Visit the CDC website at www.cdc.gov/vaccines CDC Shingles Vaccine VIS (07/08/08) Document Released: 07/17/2006 Document Revised: 12/12/2011 Document Reviewed: 01/09/2013 Exit[employer] Patient Information 2015 [employer]. This information is not intended to replace advice given to you by your health care provider. Make sure you discuss any questions you have with your health care provider.  

## 2014-06-05 LAB — URINALYSIS W MICROSCOPIC + REFLEX CULTURE
BACTERIA UA: NONE SEEN
BILIRUBIN URINE: NEGATIVE
CRYSTALS: NONE SEEN
Casts: NONE SEEN
GLUCOSE, UA: NEGATIVE mg/dL
HGB URINE DIPSTICK: NEGATIVE
Ketones, ur: NEGATIVE mg/dL
Leukocytes, UA: NEGATIVE
NITRITE: NEGATIVE
PROTEIN: NEGATIVE mg/dL
SPECIFIC GRAVITY, URINE: 1.013 (ref 1.005–1.030)
Squamous Epithelial / LPF: NONE SEEN
UROBILINOGEN UA: 0.2 mg/dL (ref 0.0–1.0)
pH: 6.5 (ref 5.0–8.0)

## 2014-06-05 LAB — VITAMIN D 25 HYDROXY (VIT D DEFICIENCY, FRACTURES): Vit D, 25-Hydroxy: 50 ng/mL (ref 30–89)

## 2014-06-06 ENCOUNTER — Ambulatory Visit (HOSPITAL_COMMUNITY)
Admission: RE | Admit: 2014-06-06 | Discharge: 2014-06-06 | Disposition: A | Discharge status: Home / Self Care | Payer: BC Managed Care – PPO | Source: Ambulatory Visit | Attending: Gynecology | Admitting: Gynecology

## 2014-06-06 DIAGNOSIS — Z1231 Encounter for screening mammogram for malignant neoplasm of breast: Secondary | ICD-10-CM | POA: Insufficient documentation

## 2014-06-11 NOTE — Telephone Encounter (Signed)
Dr. Shawna Orleans or Dr. Yong Channel  Others: dr. Alain Marion

## 2014-06-11 NOTE — Telephone Encounter (Signed)
Pt is seeing Dr Felipa Eth.  Nothing further is needed

## 2014-06-26 ENCOUNTER — Other Ambulatory Visit: Payer: Self-pay | Admitting: Gynecology

## 2014-06-26 ENCOUNTER — Ambulatory Visit (INDEPENDENT_AMBULATORY_CARE_PROVIDER_SITE_OTHER): Payer: BC Managed Care – PPO

## 2014-06-26 DIAGNOSIS — M899 Disorder of bone, unspecified: Secondary | ICD-10-CM

## 2014-06-26 DIAGNOSIS — M8580 Other specified disorders of bone density and structure, unspecified site: Secondary | ICD-10-CM

## 2014-06-26 DIAGNOSIS — M949 Disorder of cartilage, unspecified: Secondary | ICD-10-CM

## 2014-06-26 DIAGNOSIS — Z780 Asymptomatic menopausal state: Secondary | ICD-10-CM

## 2014-07-06 ENCOUNTER — Inpatient Hospital Stay (HOSPITAL_COMMUNITY)
Admission: EM | Admit: 2014-07-06 | Discharge: 2014-07-19 | DRG: 372 | Disposition: A | Discharge status: Home / Self Care | Payer: BC Managed Care – PPO | Attending: Interventional Radiology | Admitting: Interventional Radiology

## 2014-07-06 ENCOUNTER — Emergency Department (HOSPITAL_COMMUNITY): Payer: BC Managed Care – PPO

## 2014-07-06 ENCOUNTER — Encounter (HOSPITAL_COMMUNITY): Payer: Self-pay | Admitting: Emergency Medicine

## 2014-07-06 DIAGNOSIS — Z8249 Family history of ischemic heart disease and other diseases of the circulatory system: Secondary | ICD-10-CM

## 2014-07-06 DIAGNOSIS — K5732 Diverticulitis of large intestine without perforation or abscess without bleeding: Secondary | ICD-10-CM

## 2014-07-06 DIAGNOSIS — Z823 Family history of stroke: Secondary | ICD-10-CM

## 2014-07-06 DIAGNOSIS — Z889 Allergy status to unspecified drugs, medicaments and biological substances status: Secondary | ICD-10-CM | POA: Diagnosis not present

## 2014-07-06 DIAGNOSIS — K5720 Diverticulitis of large intestine with perforation and abscess without bleeding: Secondary | ICD-10-CM | POA: Diagnosis present

## 2014-07-06 DIAGNOSIS — Z885 Allergy status to narcotic agent status: Secondary | ICD-10-CM | POA: Diagnosis not present

## 2014-07-06 DIAGNOSIS — K5792 Diverticulitis of intestine, part unspecified, without perforation or abscess without bleeding: Secondary | ICD-10-CM

## 2014-07-06 DIAGNOSIS — Z803 Family history of malignant neoplasm of breast: Secondary | ICD-10-CM

## 2014-07-06 DIAGNOSIS — E876 Hypokalemia: Secondary | ICD-10-CM | POA: Diagnosis not present

## 2014-07-06 DIAGNOSIS — K5780 Diverticulitis of intestine, part unspecified, with perforation and abscess without bleeding: Secondary | ICD-10-CM

## 2014-07-06 DIAGNOSIS — K632 Fistula of intestine: Secondary | ICD-10-CM | POA: Diagnosis present

## 2014-07-06 DIAGNOSIS — R1032 Left lower quadrant pain: Secondary | ICD-10-CM | POA: Diagnosis present

## 2014-07-06 DIAGNOSIS — K630 Abscess of intestine: Secondary | ICD-10-CM | POA: Diagnosis present

## 2014-07-06 DIAGNOSIS — K5790 Diverticulosis of intestine, part unspecified, without perforation or abscess without bleeding: Secondary | ICD-10-CM

## 2014-07-06 HISTORY — DX: Diverticulitis of large intestine with perforation and abscess without bleeding: K57.20

## 2014-07-06 LAB — COMPREHENSIVE METABOLIC PANEL
ALK PHOS: 63 U/L (ref 39–117)
ALT: 21 U/L (ref 0–35)
AST: 28 U/L (ref 0–37)
Albumin: 4.1 g/dL (ref 3.5–5.2)
Anion gap: 14 (ref 5–15)
BUN: 17 mg/dL (ref 6–23)
CHLORIDE: 99 meq/L (ref 96–112)
CO2: 25 meq/L (ref 19–32)
CREATININE: 0.98 mg/dL (ref 0.50–1.10)
Calcium: 9 mg/dL (ref 8.4–10.5)
GFR calc Af Amer: 71 mL/min — ABNORMAL LOW (ref >90)
GFR, EST NON AFRICAN AMERICAN: 61 mL/min — AB (ref >90)
Glucose, Bld: 109 mg/dL — ABNORMAL HIGH (ref 70–99)
POTASSIUM: 3.6 meq/L — AB (ref 3.7–5.3)
Sodium: 138 mEq/L (ref 137–147)
Total Bilirubin: 0.5 mg/dL (ref 0.3–1.2)
Total Protein: 7 g/dL (ref 6.0–8.3)

## 2014-07-06 LAB — CBC WITH DIFFERENTIAL/PLATELET
Basophils Absolute: 0 10*3/uL (ref 0.0–0.1)
Basophils Relative: 0 % (ref 0–1)
Eosinophils Absolute: 0 10*3/uL (ref 0.0–0.7)
Eosinophils Relative: 1 % (ref 0–5)
HEMATOCRIT: 39.5 % (ref 36.0–46.0)
HEMOGLOBIN: 13.7 g/dL (ref 12.0–15.0)
LYMPHS PCT: 11 % — AB (ref 12–46)
Lymphs Abs: 0.7 10*3/uL (ref 0.7–4.0)
MCH: 30 pg (ref 26.0–34.0)
MCHC: 34.7 g/dL (ref 30.0–36.0)
MCV: 86.6 fL (ref 78.0–100.0)
MONO ABS: 0.3 10*3/uL (ref 0.1–1.0)
MONOS PCT: 5 % (ref 3–12)
NEUTROS ABS: 5.4 10*3/uL (ref 1.7–7.7)
NEUTROS PCT: 83 % — AB (ref 43–77)
Platelets: 206 10*3/uL (ref 150–400)
RBC: 4.56 MIL/uL (ref 3.87–5.11)
RDW: 13.8 % (ref 11.5–15.5)
WBC: 6.5 10*3/uL (ref 4.0–10.5)

## 2014-07-06 LAB — URINALYSIS, ROUTINE W REFLEX MICROSCOPIC
BILIRUBIN URINE: NEGATIVE
GLUCOSE, UA: NEGATIVE mg/dL
Hgb urine dipstick: NEGATIVE
Ketones, ur: 15 mg/dL — AB
LEUKOCYTES UA: NEGATIVE
Nitrite: NEGATIVE
PROTEIN: NEGATIVE mg/dL
Specific Gravity, Urine: 1.017 (ref 1.005–1.030)
Urobilinogen, UA: 0.2 mg/dL (ref 0.0–1.0)
pH: 6 (ref 5.0–8.0)

## 2014-07-06 MED ORDER — ONDANSETRON HCL 4 MG/2ML IJ SOLN
4.0000 mg | Freq: Once | INTRAMUSCULAR | Status: AC
  Administered 2014-07-06: 4 mg via INTRAVENOUS
  Filled 2014-07-06: qty 2

## 2014-07-06 MED ORDER — SODIUM CHLORIDE 0.9 % IV SOLN
INTRAVENOUS | Status: DC
  Administered 2014-07-06 – 2014-07-08 (×5): via INTRAVENOUS

## 2014-07-06 MED ORDER — ACETAMINOPHEN 650 MG RE SUPP: 650.0000 mg | Freq: Four times a day (QID) | RECTAL | Status: DC | PRN

## 2014-07-06 MED ORDER — PIPERACILLIN-TAZOBACTAM 3.375 G IVPB
3.3750 g | Freq: Three times a day (TID) | INTRAVENOUS | Status: DC
  Administered 2014-07-06 – 2014-07-11 (×15): 3.375 g via INTRAVENOUS
  Filled 2014-07-06 (×17): qty 50

## 2014-07-06 MED ORDER — SODIUM CHLORIDE 0.9 % IV BOLUS (SEPSIS)
500.0000 mL | Freq: Once | INTRAVENOUS | Status: AC
  Administered 2014-07-06: 500 mL via INTRAVENOUS

## 2014-07-06 MED ORDER — HYDROMORPHONE HCL 1 MG/ML IJ SOLN
1.0000 mg | INTRAMUSCULAR | Status: DC | PRN
  Administered 2014-07-06 – 2014-07-08 (×5): 1 mg via INTRAVENOUS
  Filled 2014-07-06 (×5): qty 1

## 2014-07-06 MED ORDER — MORPHINE SULFATE 4 MG/ML IJ SOLN
4.0000 mg | Freq: Once | INTRAMUSCULAR | Status: AC
  Administered 2014-07-06: 4 mg via INTRAVENOUS
  Filled 2014-07-06: qty 1

## 2014-07-06 MED ORDER — PANTOPRAZOLE SODIUM 40 MG IV SOLR
40.0000 mg | Freq: Every day | INTRAVENOUS | Status: DC
  Administered 2014-07-06 – 2014-07-10 (×5): 40 mg via INTRAVENOUS
  Filled 2014-07-06 (×7): qty 40

## 2014-07-06 MED ORDER — ACETAMINOPHEN 325 MG PO TABS
650.0000 mg | ORAL_TABLET | Freq: Four times a day (QID) | ORAL | Status: DC | PRN
  Administered 2014-07-07 – 2014-07-18 (×10): 650 mg via ORAL
  Filled 2014-07-06 (×11): qty 2

## 2014-07-06 MED ORDER — ENOXAPARIN SODIUM 40 MG/0.4ML ~~LOC~~ SOLN
40.0000 mg | SUBCUTANEOUS | Status: DC
  Administered 2014-07-06 – 2014-07-18 (×12): 40 mg via SUBCUTANEOUS
  Filled 2014-07-06 (×18): qty 0.4

## 2014-07-06 MED ORDER — ONDANSETRON HCL 4 MG/2ML IJ SOLN
4.0000 mg | Freq: Four times a day (QID) | INTRAMUSCULAR | Status: DC | PRN
  Administered 2014-07-07 – 2014-07-08 (×4): 4 mg via INTRAVENOUS
  Filled 2014-07-06 (×4): qty 2

## 2014-07-06 MED ORDER — FENTANYL CITRATE 0.05 MG/ML IJ SOLN
100.0000 ug | Freq: Once | INTRAMUSCULAR | Status: AC
  Administered 2014-07-06: 100 ug via INTRAVENOUS
  Filled 2014-07-06: qty 2

## 2014-07-06 MED ORDER — SODIUM CHLORIDE 0.9 % IV SOLN
INTRAVENOUS | Status: DC
  Administered 2014-07-06: 16:00:00 via INTRAVENOUS

## 2014-07-06 MED ORDER — IOHEXOL 300 MG/ML  SOLN
100.0000 mL | Freq: Once | INTRAMUSCULAR | Status: AC | PRN
  Administered 2014-07-06: 100 mL via INTRAVENOUS

## 2014-07-06 MED ORDER — IOHEXOL 300 MG/ML  SOLN
25.0000 mL | INTRAMUSCULAR | Status: DC | PRN
  Administered 2014-07-06: 25 mL via ORAL

## 2014-07-06 NOTE — ED Provider Notes (Signed)
Care taken over from Dr. Eulis Foster.  Pt with left flank pain.  Evidence of diverticulitis on CT with perforation.    17:10 discussed with Dr. Donne Hazel with general surgery who will see pt.  Results for orders placed during the hospital encounter of 07/06/14  CBC WITH DIFFERENTIAL      Result Value Ref Range   WBC 6.5  4.0 - 10.5 K/uL   RBC 4.56  3.87 - 5.11 MIL/uL   Hemoglobin 13.7  12.0 - 15.0 g/dL   HCT 39.5  36.0 - 46.0 %   MCV 86.6  78.0 - 100.0 fL   MCH 30.0  26.0 - 34.0 pg   MCHC 34.7  30.0 - 36.0 g/dL   RDW 13.8  11.5 - 15.5 %   Platelets 206  150 - 400 K/uL   Neutrophils Relative % 83 (*) 43 - 77 %   Neutro Abs 5.4  1.7 - 7.7 K/uL   Lymphocytes Relative 11 (*) 12 - 46 %   Lymphs Abs 0.7  0.7 - 4.0 K/uL   Monocytes Relative 5  3 - 12 %   Monocytes Absolute 0.3  0.1 - 1.0 K/uL   Eosinophils Relative 1  0 - 5 %   Eosinophils Absolute 0.0  0.0 - 0.7 K/uL   Basophils Relative 0  0 - 1 %   Basophils Absolute 0.0  0.0 - 0.1 K/uL  COMPREHENSIVE METABOLIC PANEL      Result Value Ref Range   Sodium 138  137 - 147 mEq/L   Potassium 3.6 (*) 3.7 - 5.3 mEq/L   Chloride 99  96 - 112 mEq/L   CO2 25  19 - 32 mEq/L   Glucose, Bld 109 (*) 70 - 99 mg/dL   BUN 17  6 - 23 mg/dL   Creatinine, Ser 0.98  0.50 - 1.10 mg/dL   Calcium 9.0  8.4 - 10.5 mg/dL   Total Protein 7.0  6.0 - 8.3 g/dL   Albumin 4.1  3.5 - 5.2 g/dL   AST 28  0 - 37 U/L   ALT 21  0 - 35 U/L   Alkaline Phosphatase 63  39 - 117 U/L   Total Bilirubin 0.5  0.3 - 1.2 mg/dL   GFR calc non Af Amer 61 (*) >90 mL/min   GFR calc Af Amer 71 (*) >90 mL/min   Anion gap 14  5 - 15  URINALYSIS, ROUTINE W REFLEX MICROSCOPIC      Result Value Ref Range   Color, Urine YELLOW  YELLOW   APPearance CLEAR  CLEAR   Specific Gravity, Urine 1.017  1.005 - 1.030   pH 6.0  5.0 - 8.0   Glucose, UA NEGATIVE  NEGATIVE mg/dL   Hgb urine dipstick NEGATIVE  NEGATIVE   Bilirubin Urine NEGATIVE  NEGATIVE   Ketones, ur 15 (*) NEGATIVE mg/dL   Protein, ur NEGATIVE  NEGATIVE mg/dL   Urobilinogen, UA 0.2  0.0 - 1.0 mg/dL   Nitrite NEGATIVE  NEGATIVE   Leukocytes, UA NEGATIVE  NEGATIVE   Ct Abdomen Pelvis W Contrast  07/06/2014   CLINICAL DATA:  Severe acute left lower quadrant pain, Initial visit  EXAM: CT ABDOMEN AND PELVIS WITH CONTRAST  TECHNIQUE: Multidetector CT imaging of the abdomen and pelvis was performed using the standard protocol following bolus administration of intravenous contrast.  CONTRAST:  113mL OMNIPAQUE IOHEXOL 300 MG/ML  SOLN  COMPARISON:  None.  FINDINGS: Visualized lung bases are clear. Bone windows reveal no  acute osseous abnormalities.  There is a low-attenuation 1 cm focus in the right lobe of the liver on the initial series which is not seen well on the delayed images. Liver is otherwise normal. There is a small splenule. The spleen is otherwise normal. Pancreas and adrenal glands are normal. Kidneys are normal. Aorta is normal.  There is a small hiatal hernia. The stomach is normal. Small bowel is normal. Bladder is normal. Reproductive organs are normal.  There is significant diverticulosis involving the sigmoid colon. There is wall thickening of the mid sigmoid colon seen in the left side of the pelvis. Along the left lateral border of the sigmoid colon there is mild inflammatory change in the mesenteric fat. There is a tiny focus of gas extending into the left adnexa on image number 64. Just cranial to the sigmoid colon, there is a multi loculated focus of extraluminal gas measuring 48 x 18 by 42 mm.  IMPRESSION: Evidence of diverticulitis. There is also evidence of perforation. There is a 5 cm focus of gas contiguous and cranial to the sigmoid colon which likely represents a contained perforation. There is also a tiny focus of extraluminal gas in the left adnexa to the left of the sigmoid colon.   Electronically Signed   By: Skipper Cliche M.D.   On: 07/06/2014 16:38      Malvin Johns, MD 07/06/14 [phone]

## 2014-07-06 NOTE — ED Notes (Addendum)
Per pt sts left flank pain and left lower abdominal pain and nausea that started a few hours ago. sts area hurts to touch.

## 2014-07-06 NOTE — Progress Notes (Signed)
Pt working on oral contrast at this time

## 2014-07-06 NOTE — H&P (Signed)
Katherine Hopkins is an 62 y.o. female.   Chief Complaint: abdominal pain HPI: 66 yof who presents with acute onset of left lower abdominal pain that she has never had before.  This was not going away at home. She was passing flatus and having bms earlier today. She has no fever. She was a little nauseated but no emesis. She has up to date colonoscopies by her report.   Past Medical History  Diagnosis Date  . Bulging disc     Past Surgical History  Procedure Laterality Date  . Appendectomy    . Knee surgery    . Foot surgery    . Elbow surgery      Family History  Problem Relation Age of Onset  . Breast cancer Mother 74  . Hypertension Mother   . Hyperlipidemia Father   . Stroke Father   . Cancer Brother     LUNG   Social History:  reports that she has never smoked. She does not have any smokeless tobacco history on file. She reports that she does not drink alcohol. Her drug history is not on file.  Allergies:  Allergies  Allergen Reactions  . Betadine [Povidone Iodine] Itching  . Codeine Itching   Meds none  Results for orders placed during the hospital encounter of 07/06/14 (from the past 48 hour(s))  CBC WITH DIFFERENTIAL     Status: Abnormal   Collection Time    07/06/14  2:35 PM      Result Value Ref Range   WBC 6.5  4.0 - 10.5 K/uL   RBC 4.56  3.87 - 5.11 MIL/uL   Hemoglobin 13.7  12.0 - 15.0 g/dL   HCT 39.5  36.0 - 46.0 %   MCV 86.6  78.0 - 100.0 fL   MCH 30.0  26.0 - 34.0 pg   MCHC 34.7  30.0 - 36.0 g/dL   RDW 13.8  11.5 - 15.5 %   Platelets 206  150 - 400 K/uL   Neutrophils Relative % 83 (*) 43 - 77 %   Neutro Abs 5.4  1.7 - 7.7 K/uL   Lymphocytes Relative 11 (*) 12 - 46 %   Lymphs Abs 0.7  0.7 - 4.0 K/uL   Monocytes Relative 5  3 - 12 %   Monocytes Absolute 0.3  0.1 - 1.0 K/uL   Eosinophils Relative 1  0 - 5 %   Eosinophils Absolute 0.0  0.0 - 0.7 K/uL   Basophils Relative 0  0 - 1 %   Basophils Absolute 0.0  0.0 - 0.1 K/uL  COMPREHENSIVE METABOLIC  PANEL     Status: Abnormal   Collection Time    07/06/14  2:35 PM      Result Value Ref Range   Sodium 138  137 - 147 mEq/L   Potassium 3.6 (*) 3.7 - 5.3 mEq/L   Chloride 99  96 - 112 mEq/L   CO2 25  19 - 32 mEq/L   Glucose, Bld 109 (*) 70 - 99 mg/dL   BUN 17  6 - 23 mg/dL   Creatinine, Ser 0.98  0.50 - 1.10 mg/dL   Calcium 9.0  8.4 - 10.5 mg/dL   Total Protein 7.0  6.0 - 8.3 g/dL   Albumin 4.1  3.5 - 5.2 g/dL   AST 28  0 - 37 U/L   ALT 21  0 - 35 U/L   Alkaline Phosphatase 63  39 - 117 U/L   Total Bilirubin 0.5  0.3 - 1.2 mg/dL   GFR calc non Af Amer 61 (*) >90 mL/min   GFR calc Af Amer 71 (*) >90 mL/min   Comment: (NOTE)     The eGFR has been calculated using the CKD EPI equation.     This calculation has not been validated in all clinical situations.     eGFR's persistently <90 mL/min signify possible Chronic Kidney     Disease.   Anion gap 14  5 - 15  URINALYSIS, ROUTINE W REFLEX MICROSCOPIC     Status: Abnormal   Collection Time    07/06/14  3:09 PM      Result Value Ref Range   Color, Urine YELLOW  YELLOW   APPearance CLEAR  CLEAR   Specific Gravity, Urine 1.017  1.005 - 1.030   pH 6.0  5.0 - 8.0   Glucose, UA NEGATIVE  NEGATIVE mg/dL   Hgb urine dipstick NEGATIVE  NEGATIVE   Bilirubin Urine NEGATIVE  NEGATIVE   Ketones, ur 15 (*) NEGATIVE mg/dL   Protein, ur NEGATIVE  NEGATIVE mg/dL   Urobilinogen, UA 0.2  0.0 - 1.0 mg/dL   Nitrite NEGATIVE  NEGATIVE   Leukocytes, UA NEGATIVE  NEGATIVE   Comment: MICROSCOPIC NOT DONE ON URINES WITH NEGATIVE PROTEIN, BLOOD, LEUKOCYTES, NITRITE, OR GLUCOSE <1000 mg/dL.   Ct Abdomen Pelvis W Contrast  07/06/2014   CLINICAL DATA:  Severe acute left lower quadrant pain, Initial visit  EXAM: CT ABDOMEN AND PELVIS WITH CONTRAST  TECHNIQUE: Multidetector CT imaging of the abdomen and pelvis was performed using the standard protocol following bolus administration of intravenous contrast.  CONTRAST:  164mL OMNIPAQUE IOHEXOL 300 MG/ML  SOLN   COMPARISON:  None.  FINDINGS: Visualized lung bases are clear. Bone windows reveal no acute osseous abnormalities.  There is a low-attenuation 1 cm focus in the right lobe of the liver on the initial series which is not seen well on the delayed images. Liver is otherwise normal. There is a small splenule. The spleen is otherwise normal. Pancreas and adrenal glands are normal. Kidneys are normal. Aorta is normal.  There is a small hiatal hernia. The stomach is normal. Small bowel is normal. Bladder is normal. Reproductive organs are normal.  There is significant diverticulosis involving the sigmoid colon. There is wall thickening of the mid sigmoid colon seen in the left side of the pelvis. Along the left lateral border of the sigmoid colon there is mild inflammatory change in the mesenteric fat. There is a tiny focus of gas extending into the left adnexa on image number 64. Just cranial to the sigmoid colon, there is a multi loculated focus of extraluminal gas measuring 48 x 18 by 42 mm.  IMPRESSION: Evidence of diverticulitis. There is also evidence of perforation. There is a 5 cm focus of gas contiguous and cranial to the sigmoid colon which likely represents a contained perforation. There is also a tiny focus of extraluminal gas in the left adnexa to the left of the sigmoid colon.   Electronically Signed   By: Skipper Cliche M.D.   On: 07/06/2014 16:38    Review of Systems  Constitutional: Negative for fever and chills.  Respiratory: Negative for shortness of breath.   Cardiovascular: Negative for chest pain.  Gastrointestinal: Positive for nausea and abdominal pain. Negative for vomiting, diarrhea and constipation.    Blood pressure 130/66, pulse 76, temperature 98.4 F (36.9 C), temperature source Oral, resp. rate 23, SpO2 92.00%. Physical Exam  Vitals reviewed.  Constitutional: She is oriented to person, place, and time. She appears well-developed and well-nourished.  Eyes: No scleral icterus.   Cardiovascular: Normal rate, regular rhythm and normal heart sounds.   Respiratory: Effort normal and breath sounds normal. She has no wheezes. She has no rales.  GI: Soft. Bowel sounds are normal. There is tenderness in the left lower quadrant. There is no rebound.  Neurological: She is alert and oriented to person, place, and time.  Skin: She is not diaphoretic.     Assessment/Plan Diverticulitis  I discussed pathophysiology of disease.  She has contained perforation, nl wbc and afebrile. We will try antibiotics and consider a drain pending a discussion about interventional radiology. Discussed indications for possible surgery.  Milyn Stapleton 07/06/2014, 6:08 PM

## 2014-07-06 NOTE — Progress Notes (Signed)
ANTIBIOTIC CONSULT NOTE - INITIAL  Pharmacy Consult for zosyn Indication: diverticulitis   Allergies  Allergen Reactions  . Betadine [Povidone Iodine] Itching  . Codeine Itching    Patient Measurements: Height: 5' 6.5" (168.9 cm) Weight: 153 lb 3.5 oz (69.5 kg) IBW/kg (Calculated) : 60.45  Vital Signs: Temp: 98.8 F (37.1 C) (10/04 2004) Temp Source: Oral (10/04 2004) BP: 137/73 mmHg (10/04 2004) Pulse Rate: 74 (10/04 2004) Intake/Output from previous day:   Intake/Output from this shift:    Labs:  Recent Labs  07/06/14 1435  WBC 6.5  HGB 13.7  PLT 206  CREATININE 0.98   Estimated Creatinine Clearance: 57.6 ml/min (by C-G formula based on Cr of 0.98). No results found for this basename: VANCOTROUGH, VANCOPEAK, VANCORANDOM, GENTTROUGH, GENTPEAK, GENTRANDOM, TOBRATROUGH, TOBRAPEAK, TOBRARND, AMIKACINPEAK, AMIKACINTROU, AMIKACIN,  in the last 72 hours   Microbiology: No results found for this or any previous visit (from the past 720 hour(s)).  Medical History: Past Medical History  Diagnosis Date  . Bulging disc     Assessment: Patient is a 63 y.o F presented to the ED with c/o abdominal pain.  Abdomen pelvis CT is suspicious for "contained perforation." To start zosyn for diverticulitis.  Scr 0.98 (crcl~57).   Plan:  1) zosyn 3.375gm IV q8h (infuse over 4 hours) 2) pharmacy will sign off.  Re-consult [place] if crcl<20 or need further assistance with abx.   Gimena Buick P 07/06/2014,9:15 PM

## 2014-07-06 NOTE — ED Provider Notes (Signed)
CSN: [id number]     Arrival date & time 07/06/14  1419 History   First MD Initiated Contact with Patient 07/06/14 1442     Chief Complaint  Patient presents with  . Flank Pain  . Abdominal Pain     (Consider location/radiation/quality/duration/timing/severity/associated sxs/prior Treatment) HPI  Crampy abdominal pain, left lower quadrant, started today at 11 AM, and has been persistent. Pain is severe, 8/10. She ate normally and had a normal bowel movement this morning. There has been no blood in stool. She denies fever, cough, weakness, dizziness, or anorexia. She had a colonoscopy one year ago and had a polyp removed. There was no further treatment after that. She does not know if she has diverticulosis. She is taking her usual medicines as directed. There are no other known modifying factors.  Past Medical History  Diagnosis Date  . Bulging disc    Past Surgical History  Procedure Laterality Date  . Appendectomy    . Knee surgery    . Foot surgery    . Elbow surgery     Family History  Problem Relation Age of Onset  . Breast cancer Mother 14  . Hypertension Mother   . Hyperlipidemia Father   . Stroke Father   . Cancer Brother     LUNG   History  Substance Use Topics  . Smoking status: Never Smoker   . Smokeless tobacco: Not on file  . Alcohol Use: No   OB History   Grav Para Term Preterm Abortions TAB SAB Ect Mult Living   2 2        2      Review of Systems  All other systems reviewed and are negative.     Allergies  Betadine and Codeine  Home Medications   Prior to Admission medications   Medication Sig Start Date End Date Taking? Authorizing Provider  calcium carbonate (OS-CAL) 600 MG TABS tablet Take 600 mg by mouth 2 (two) times daily with a meal.   Yes Historical Provider, MD  cholecalciferol (VITAMIN D) 1000 UNITS tablet Take 1,000 Units by mouth daily.   Yes Historical Provider, MD  glucosamine-chondroitin 500-400 MG tablet Take 1 tablet by mouth  3 (three) times daily.   Yes Historical Provider, MD  Omega-3 Fatty Acids (FISH OIL) 1000 MG CAPS Take 1 capsule by mouth daily.    Yes Historical Provider, MD   BP 131/71  Pulse 77  Temp(Src) 98.2 F (36.8 C) (Oral)  Resp 17  Ht 5' 6.5" (1.689 m)  Wt 153 lb 3.5 oz (69.5 kg)  BMI 24.36 kg/m2  SpO2 99% Physical Exam  Nursing note and vitals reviewed. Constitutional: She is oriented to person, place, and time. She appears well-developed and well-nourished. She appears distressed (She is uncomfortable).  HENT:  Head: Normocephalic and atraumatic.  Eyes: Conjunctivae and EOM are normal. Pupils are equal, round, and reactive to light.  Neck: Normal range of motion and phonation normal. Neck supple.  Cardiovascular: Normal rate and regular rhythm.   Pulmonary/Chest: Effort normal and breath sounds normal. She exhibits no tenderness.  Abdominal: Soft. She exhibits no distension and no mass. There is tenderness (left and right lower quadrants, bilateral, mild). There is no rebound and no guarding.  Musculoskeletal: Normal range of motion.  Neurological: She is alert and oriented to person, place, and time. She exhibits normal muscle tone.  Skin: Skin is warm and dry.  Psychiatric: She has a normal mood and affect. Her behavior is normal. Judgment and  thought content normal.    ED Course  Procedures (including critical care time)  Medications  0.9 %  sodium chloride infusion ( Intravenous New Bag/Given 07/06/14 1534)  iohexol (OMNIPAQUE) 300 MG/ML solution 25 mL (25 mLs Oral Contrast Given 07/06/14 1500)  enoxaparin (LOVENOX) injection 40 mg (40 mg Subcutaneous Given 07/07/14 2236)  0.9 %  sodium chloride infusion ( Intravenous New Bag/Given 07/08/14 0553)  acetaminophen (TYLENOL) tablet 650 mg (650 mg Oral Given 07/07/14 1408)    Or  acetaminophen (TYLENOL) suppository 650 mg ( Rectal See Alternative 07/07/14 1408)  HYDROmorphone (DILAUDID) injection 1 mg (1 mg Intravenous Given 07/08/14  0551)  ondansetron (ZOFRAN) injection 4 mg (4 mg Intravenous Given 07/08/14 0439)  pantoprazole (PROTONIX) injection 40 mg (40 mg Intravenous Given 07/07/14 2234)  piperacillin-tazobactam (ZOSYN) IVPB 3.375 g (3.375 g Intravenous Given 07/08/14 0554)  sodium chloride 0.9 % bolus 500 mL (0 mLs Intravenous Stopped 07/06/14 1625)  ondansetron (ZOFRAN) injection 4 mg (4 mg Intravenous Given 07/06/14 1450)  fentaNYL (SUBLIMAZE) injection 100 mcg (100 mcg Intravenous Given 07/06/14 1450)  iohexol (OMNIPAQUE) 300 MG/ML solution 100 mL (100 mLs Intravenous Contrast Given 07/06/14 1610)  morphine 4 MG/ML injection 4 mg (4 mg Intravenous Given 07/06/14 1636)  ketorolac (TORADOL) 15 MG/ML injection 15 mg (15 mg Intravenous Given 07/07/14 1924)    Patient Vitals for the past 24 hrs:  BP Temp Temp src Pulse Resp SpO2  07/08/14 0555 131/71 mmHg 98.2 F (36.8 C) Oral 77 17 99 %  07/07/14 2112 113/60 mmHg 98.8 F (37.1 C) Oral 73 16 97 %  07/07/14 1300 117/67 mmHg 99 F (37.2 C) Oral 78 18 95 %        Labs Review Labs Reviewed  CBC WITH DIFFERENTIAL - Abnormal; Notable for the following:    Neutrophils Relative % 83 (*)    Lymphocytes Relative 11 (*)    All other components within normal limits  COMPREHENSIVE METABOLIC PANEL - Abnormal; Notable for the following:    Potassium 3.6 (*)    Glucose, Bld 109 (*)    GFR calc non Af Amer 61 (*)    GFR calc Af Amer 71 (*)    All other components within normal limits  URINALYSIS, ROUTINE W REFLEX MICROSCOPIC - Abnormal; Notable for the following:    Ketones, ur 15 (*)    All other components within normal limits  BASIC METABOLIC PANEL - Abnormal; Notable for the following:    Glucose, Bld 104 (*)    Calcium 8.1 (*)    GFR calc non Af Amer 71 (*)    GFR calc Af Amer 83 (*)    All other components within normal limits  URINE CULTURE  CBC  POC OCCULT BLOOD, ED  OCCULT BLOOD, POC DEVICE    Imaging Review Ct Abdomen Pelvis W Contrast  07/06/2014    CLINICAL DATA:  Severe acute left lower quadrant pain, Initial visit  EXAM: CT ABDOMEN AND PELVIS WITH CONTRAST  TECHNIQUE: Multidetector CT imaging of the abdomen and pelvis was performed using the standard protocol following bolus administration of intravenous contrast.  CONTRAST:  170mL OMNIPAQUE IOHEXOL 300 MG/ML  SOLN  COMPARISON:  None.  FINDINGS: Visualized lung bases are clear. Bone windows reveal no acute osseous abnormalities.  There is a low-attenuation 1 cm focus in the right lobe of the liver on the initial series which is not seen well on the delayed images. Liver is otherwise normal. There is a small splenule. The spleen is  otherwise normal. Pancreas and adrenal glands are normal. Kidneys are normal. Aorta is normal.  There is a small hiatal hernia. The stomach is normal. Small bowel is normal. Bladder is normal. Reproductive organs are normal.  There is significant diverticulosis involving the sigmoid colon. There is wall thickening of the mid sigmoid colon seen in the left side of the pelvis. Along the left lateral border of the sigmoid colon there is mild inflammatory change in the mesenteric fat. There is a tiny focus of gas extending into the left adnexa on image number 64. Just cranial to the sigmoid colon, there is a multi loculated focus of extraluminal gas measuring 48 x 18 by 42 mm.  IMPRESSION: Evidence of diverticulitis. There is also evidence of perforation. There is a 5 cm focus of gas contiguous and cranial to the sigmoid colon which likely represents a contained perforation. There is also a tiny focus of extraluminal gas in the left adnexa to the left of the sigmoid colon.   Electronically Signed   By: Skipper Cliche M.D.   On: 07/06/2014 16:38     EKG Interpretation None      MDM   Final diagnoses:  Diverticulitis of large intestine with perforation without bleeding   Nursing Notes Reviewed/ Care Coordinated, and agree without changes. Applicable Imaging Reviewed.   Interpretation of Laboratory Data incorporated into ED treatment   Care to Dr. Tamera Punt, at change of shift, to evaluate after return of CT imaging    Richarda Blade, MD 07/08/14 [phone]

## 2014-07-07 LAB — BASIC METABOLIC PANEL
Anion gap: 10 (ref 5–15)
BUN: 8 mg/dL (ref 6–23)
CO2: 26 mEq/L (ref 19–32)
Calcium: 8.1 mg/dL — ABNORMAL LOW (ref 8.4–10.5)
Chloride: 101 mEq/L (ref 96–112)
Creatinine, Ser: 0.86 mg/dL (ref 0.50–1.10)
GFR calc Af Amer: 83 mL/min — ABNORMAL LOW (ref >90)
GFR calc non Af Amer: 71 mL/min — ABNORMAL LOW (ref >90)
Glucose, Bld: 104 mg/dL — ABNORMAL HIGH (ref 70–99)
Potassium: 3.8 mEq/L (ref 3.7–5.3)
Sodium: 137 mEq/L (ref 137–147)

## 2014-07-07 LAB — CBC
HCT: 36.7 % (ref 36.0–46.0)
Hemoglobin: 12.8 g/dL (ref 12.0–15.0)
MCH: 30.3 pg (ref 26.0–34.0)
MCHC: 34.9 g/dL (ref 30.0–36.0)
MCV: 86.8 fL (ref 78.0–100.0)
PLATELETS: 183 10*3/uL (ref 150–400)
RBC: 4.23 MIL/uL (ref 3.87–5.11)
RDW: 13.9 % (ref 11.5–15.5)
WBC: 10.2 10*3/uL (ref 4.0–10.5)

## 2014-07-07 LAB — OCCULT BLOOD, POC DEVICE: Fecal Occult Bld: NEGATIVE

## 2014-07-07 MED ORDER — KETOROLAC TROMETHAMINE 15 MG/ML IJ SOLN
15.0000 mg | Freq: Once | INTRAMUSCULAR | Status: AC
  Administered 2014-07-07: 15 mg via INTRAVENOUS
  Filled 2014-07-07: qty 1

## 2014-07-07 NOTE — Progress Notes (Signed)
Patient ID: Katherine Hopkins, female   DOB: 15-Dec-1950, 62 y.o.   MRN: [id number]     [address] [place], [place] [address]    Phone: [phone] FAX: [phone]     Subjective: Pain is much better 2-3/10.  No n/v, diarrhea.    Objective:  Vital signs:  Filed Vitals:   07/06/14 1930 07/06/14 2004 07/06/14 2115 07/07/14 0538  BP: 129/68 137/73 130/66 117/64  Pulse: 75 74 73 66  Temp:  98.8 F (37.1 C) 98.3 F (36.8 C) 98.2 F (36.8 C)  TempSrc:  Oral Oral Oral  Resp: _0 Height:  5' 6.5" (1.689 m)    Weight:  153 lb 3.5 oz (69.5 kg)    SpO2: 93% 96% 92% 95%    Last BM Date: 07/06/14  Intake/Output   Yesterday:  10/04 0701 - 10/05 0700 In: 1500 [I.V.:1500] Out: -  This shift:    I/O last 3 completed shifts: In: 1500 [I.V.:1500] Out: -     Physical Exam: General: Pt awake/alert/oriented x4 in no acute distress Abdomen: Soft.  Nondistended. Moderate TTP to LLQ.  No evidence of peritonitis.  No incarcerated hernias.   Problem List:   Active Problems:   Diverticulitis of colon    Results:   Labs: Results for orders placed during the hospital encounter of 07/06/14 (from the past 48 hour(s))  CBC WITH DIFFERENTIAL     Status: Abnormal   Collection Time    07/06/14  2:35 PM      Result Value Ref Range   WBC 6.5  4.0 - 10.5 K/uL   RBC 4.56  3.87 - 5.11 MIL/uL   Hemoglobin 13.7  12.0 - 15.0 g/dL   HCT 39.5  36.0 - 46.0 %   MCV 86.6  78.0 - 100.0 fL   MCH 30.0  26.0 - 34.0 pg   MCHC 34.7  30.0 - 36.0 g/dL   RDW 13.8  11.5 - 15.5 %   Platelets 206  150 - 400 K/uL   Neutrophils Relative % 83 (*) 43 - 77 %   Neutro Abs 5.4  1.7 - 7.7 K/uL   Lymphocytes Relative 11 (*) 12 - 46 %   Lymphs Abs 0.7  0.7 - 4.0 K/uL   Monocytes Relative 5  3 - 12 %   Monocytes Absolute 0.3  0.1 - 1.0 K/uL   Eosinophils Relative 1  0 - 5 %   Eosinophils Absolute 0.0  0.0 - 0.7 K/uL   Basophils  Relative 0  0 - 1 %   Basophils Absolute 0.0  0.0 - 0.1 K/uL  COMPREHENSIVE METABOLIC PANEL     Status: Abnormal   Collection Time    07/06/14  2:35 PM      Result Value Ref Range   Sodium 138  137 - 147 mEq/L   Potassium 3.6 (*) 3.7 - 5.3 mEq/L   Chloride 99  96 - 112 mEq/L   CO2 25  19 - 32 mEq/L   Glucose, Bld 109 (*) 70 - 99 mg/dL   BUN 17  6 - 23 mg/dL   Creatinine, Ser 0.98  0.50 - 1.10 mg/dL   Calcium 9.0  8.4 - 10.5 mg/dL   Total Protein 7.0  6.0 - 8.3 g/dL   Albumin 4.1  3.5 - 5.2 g/dL   AST 28  0 - 37 U/L   ALT 21  0 - 35 U/L   Alkaline Phosphatase 63  39 - 117 U/L   Total Bilirubin 0.5  0.3 - 1.2 mg/dL   GFR calc non Af Amer 61 (*) >90 mL/min   GFR calc Af Amer 71 (*) >90 mL/min   Comment: (NOTE)     The eGFR has been calculated using the CKD EPI equation.     This calculation has not been validated in all clinical situations.     eGFR's persistently <90 mL/min signify possible Chronic Kidney     Disease.   Anion gap 14  5 - 15  URINALYSIS, ROUTINE W REFLEX MICROSCOPIC     Status: Abnormal   Collection Time    07/06/14  3:09 PM      Result Value Ref Range   Color, Urine YELLOW  YELLOW   APPearance CLEAR  CLEAR   Specific Gravity, Urine 1.017  1.005 - 1.030   pH 6.0  5.0 - 8.0   Glucose, UA NEGATIVE  NEGATIVE mg/dL   Hgb urine dipstick NEGATIVE  NEGATIVE   Bilirubin Urine NEGATIVE  NEGATIVE   Ketones, ur 15 (*) NEGATIVE mg/dL   Protein, ur NEGATIVE  NEGATIVE mg/dL   Urobilinogen, UA 0.2  0.0 - 1.0 mg/dL   Nitrite NEGATIVE  NEGATIVE   Leukocytes, UA NEGATIVE  NEGATIVE   Comment: MICROSCOPIC NOT DONE ON URINES WITH NEGATIVE PROTEIN, BLOOD, LEUKOCYTES, NITRITE, OR GLUCOSE <1000 mg/dL.  BASIC METABOLIC PANEL     Status: Abnormal   Collection Time    07/07/14  6:25 AM      Result Value Ref Range   Sodium 137  137 - 147 mEq/L   Potassium 3.8  3.7 - 5.3 mEq/L   Chloride 101  96 - 112 mEq/L   CO2 26  19 - 32 mEq/L   Glucose, Bld 104 (*) 70 - 99 mg/dL   BUN 8  6  - 23 mg/dL   Creatinine, Ser 0.86  0.50 - 1.10 mg/dL   Calcium 8.1 (*) 8.4 - 10.5 mg/dL   GFR calc non Af Amer 71 (*) >90 mL/min   GFR calc Af Amer 83 (*) >90 mL/min   Comment: (NOTE)     The eGFR has been calculated using the CKD EPI equation.     This calculation has not been validated in all clinical situations.     eGFR's persistently <90 mL/min signify possible Chronic Kidney     Disease.   Anion gap 10  5 - 15  CBC     Status: None   Collection Time    07/07/14  6:25 AM      Result Value Ref Range   WBC 10.2  4.0 - 10.5 K/uL   RBC 4.23  3.87 - 5.11 MIL/uL   Hemoglobin 12.8  12.0 - 15.0 g/dL   HCT 36.7  36.0 - 46.0 %   MCV 86.8  78.0 - 100.0 fL   MCH 30.3  26.0 - 34.0 pg   MCHC 34.9  30.0 - 36.0 g/dL   RDW 13.9  11.5 - 15.5 %   Platelets 183  150 - 400 K/uL    Imaging / Studies: Ct Abdomen Pelvis W Contrast  07/06/2014   CLINICAL DATA:  Severe acute left lower quadrant pain, Initial visit  EXAM: CT ABDOMEN AND PELVIS WITH CONTRAST  TECHNIQUE: Multidetector CT imaging of the abdomen and pelvis was performed using the standard protocol following bolus administration of intravenous contrast.  CONTRAST:  198mL OMNIPAQUE  IOHEXOL 300 MG/ML  SOLN  COMPARISON:  None.  FINDINGS: Visualized lung bases are clear. Bone windows reveal no acute osseous abnormalities.  There is a low-attenuation 1 cm focus in the right lobe of the liver on the initial series which is not seen well on the delayed images. Liver is otherwise normal. There is a small splenule. The spleen is otherwise normal. Pancreas and adrenal glands are normal. Kidneys are normal. Aorta is normal.  There is a small hiatal hernia. The stomach is normal. Small bowel is normal. Bladder is normal. Reproductive organs are normal.  There is significant diverticulosis involving the sigmoid colon. There is wall thickening of the mid sigmoid colon seen in the left side of the pelvis. Along the left lateral border of the sigmoid colon there  is mild inflammatory change in the mesenteric fat. There is a tiny focus of gas extending into the left adnexa on image number 64. Just cranial to the sigmoid colon, there is a multi loculated focus of extraluminal gas measuring 48 x 18 by 42 mm.  IMPRESSION: Evidence of diverticulitis. There is also evidence of perforation. There is a 5 cm focus of gas contiguous and cranial to the sigmoid colon which likely represents a contained perforation. There is also a tiny focus of extraluminal gas in the left adnexa to the left of the sigmoid colon.   Electronically Signed   By: Skipper Cliche M.D.   On: 07/06/2014 16:38    Medications / Allergies:  Scheduled Meds: . enoxaparin (LOVENOX) injection  40 mg Subcutaneous Q24H  . pantoprazole (PROTONIX) IV  40 mg Intravenous QHS  . piperacillin-tazobactam (ZOSYN)  IV  3.375 g Intravenous Q8H   Continuous Infusions: . sodium chloride 125 mL/hr at 07/06/14 1534  . sodium chloride 125 mL/hr at 07/07/14 0601   PRN Meds:.acetaminophen, acetaminophen, HYDROmorphone (DILAUDID) injection, iohexol, ondansetron  Antibiotics: Anti-infectives   Start     Dose/Rate Route Frequency Ordered Stop   07/06/14 2200  piperacillin-tazobactam (ZOSYN) IVPB 3.375 g     3.375 g 12.5 mL/hr over 240 Minutes Intravenous Every 8 hours 07/06/14 2121          Assessment/Plan Diverticulitis with contained perforation -continue bowel rest today, will consider clears if pain is improved -IVF -zosyn -SCD/lovenox -pain control -mobilize  -colonoscopy is UTD  Addendum 1345---IR reviewed the CT, no window to drain.  Recommend Atbx and repeat CT in 2 days if no improvement.    Erby Pian, [facility] Surgery Pager [phone]) For consults and floor pages call [phone](7A-4:30P)  07/07/2014 8:28 AM

## 2014-07-08 LAB — URINE CULTURE: Colony Count: >=100000

## 2014-07-08 MED ORDER — PROMETHAZINE HCL 25 MG/ML IJ SOLN
12.5000 mg | Freq: Four times a day (QID) | INTRAMUSCULAR | Status: DC | PRN
  Administered 2014-07-08: 25 mg via INTRAVENOUS
  Filled 2014-07-08: qty 1

## 2014-07-08 MED ORDER — DEXTROSE 5 % IV SOLN
INTRAVENOUS | Status: DC
Start: 2014-07-08 — End: 2014-07-11
  Administered 2014-07-08 – 2014-07-10 (×6): via INTRAVENOUS

## 2014-07-08 NOTE — Progress Notes (Signed)
UR completed.  Shams Fill, RN BSN [facility] CCM Trauma/Neuro ICU Case Manager [phone]  

## 2014-07-08 NOTE — Progress Notes (Signed)
Patient ID: Katherine Hopkins, female   DOB: [birth year], 63 y.o.   MRN: [id number]     [address] [place], [place] [address]    Phone: [phone] FAX: [phone]     Subjective: Headache, likely rebound from pain meds.  Better with dose of toradol.  Pain worse overnight, now better.  Had diarrhea.  Afebrile.  VSS.    Objective:  Vital signs:  Filed Vitals:   07/07/14 0538 07/07/14 1300 07/07/14 2112 07/08/14 0555  BP: 117/64 117/67 113/60 131/71  Pulse: 66 78 73 77  Temp: 98.2 F (36.8 C) 99 F (37.2 C) 98.8 F (37.1 C) 98.2 F (36.8 C)  TempSrc: Oral Oral Oral Oral  Resp: $Remo'16 18 16 17  'WFMRb$ Height:      Weight:      SpO2: 95% 95% 97% 99%    Last BM Date: 07/06/14  Intake/Output   Yesterday:  10/05 0701 - 10/06 0700 In: 3085.4 [I.V.:2985.4; IV Piggyback:100] Out: 1900 [Urine:1900] This shift:    I/O last 3 completed shifts: In: 4585.4 [I.V.:4485.4; IV Piggyback:100] Out: 1900 [Urine:1900]    Physical Exam: General: Pt awake/alert/oriented x4 in no acute distress Chest: cta.  No chest wall pain w good excursion CV:  Pulses intact.  Regular rhythm MS: Normal AROM mjr joints.  No obvious deformity Abdomen: Soft.  Nondistended.  Moderate TTP RLQ and LLQ.  No evidence of peritonitis.  No incarcerated hernias. Ext:  SCDs BLE.  No mjr edema.  No cyanosis Skin: No petechiae / purpura   Problem List:   Active Problems:   Diverticulitis of colon    Results:   Labs: Results for orders placed during the hospital encounter of 07/06/14 (from the past 48 hour(s))  CBC WITH DIFFERENTIAL     Status: Abnormal   Collection Time    07/06/14  2:35 PM      Result Value Ref Range   WBC 6.5  4.0 - 10.5 K/uL   RBC 4.56  3.87 - 5.11 MIL/uL   Hemoglobin 13.7  12.0 - 15.0 g/dL   HCT 39.5  36.0 - 46.0 %   MCV 86.6  78.0 - 100.0 fL   MCH 30.0  26.0 - 34.0 pg   MCHC 34.7  30.0 - 36.0 g/dL   RDW 13.8  11.5 -  15.5 %   Platelets 206  150 - 400 K/uL   Neutrophils Relative % 83 (*) 43 - 77 %   Neutro Abs 5.4  1.7 - 7.7 K/uL   Lymphocytes Relative 11 (*) 12 - 46 %   Lymphs Abs 0.7  0.7 - 4.0 K/uL   Monocytes Relative 5  3 - 12 %   Monocytes Absolute 0.3  0.1 - 1.0 K/uL   Eosinophils Relative 1  0 - 5 %   Eosinophils Absolute 0.0  0.0 - 0.7 K/uL   Basophils Relative 0  0 - 1 %   Basophils Absolute 0.0  0.0 - 0.1 K/uL  COMPREHENSIVE METABOLIC PANEL     Status: Abnormal   Collection Time    07/06/14  2:35 PM      Result Value Ref Range   Sodium 138  137 - 147 mEq/L   Potassium 3.6 (*) 3.7 - 5.3 mEq/L   Chloride 99  96 - 112 mEq/L   CO2 25  19 - 32 mEq/L   Glucose, Bld 109 (*) 70 - 99 mg/dL  BUN 17  6 - 23 mg/dL   Creatinine, Ser 0.98  0.50 - 1.10 mg/dL   Calcium 9.0  8.4 - 10.5 mg/dL   Total Protein 7.0  6.0 - 8.3 g/dL   Albumin 4.1  3.5 - 5.2 g/dL   AST 28  0 - 37 U/L   ALT 21  0 - 35 U/L   Alkaline Phosphatase 63  39 - 117 U/L   Total Bilirubin 0.5  0.3 - 1.2 mg/dL   GFR calc non Af Amer 61 (*) >90 mL/min   GFR calc Af Amer 71 (*) >90 mL/min   Comment: (NOTE)     The eGFR has been calculated using the CKD EPI equation.     This calculation has not been validated in all clinical situations.     eGFR's persistently <90 mL/min signify possible Chronic Kidney     Disease.   Anion gap 14  5 - 15  OCCULT BLOOD, POC DEVICE     Status: None   Collection Time    07/06/14  3:02 PM      Result Value Ref Range   Fecal Occult Bld NEGATIVE  NEGATIVE  URINALYSIS, ROUTINE W REFLEX MICROSCOPIC     Status: Abnormal   Collection Time    07/06/14  3:09 PM      Result Value Ref Range   Color, Urine YELLOW  YELLOW   APPearance CLEAR  CLEAR   Specific Gravity, Urine 1.017  1.005 - 1.030   pH 6.0  5.0 - 8.0   Glucose, UA NEGATIVE  NEGATIVE mg/dL   Hgb urine dipstick NEGATIVE  NEGATIVE   Bilirubin Urine NEGATIVE  NEGATIVE   Ketones, ur 15 (*) NEGATIVE mg/dL   Protein, ur NEGATIVE  NEGATIVE mg/dL    Urobilinogen, UA 0.2  0.0 - 1.0 mg/dL   Nitrite NEGATIVE  NEGATIVE   Leukocytes, UA NEGATIVE  NEGATIVE   Comment: MICROSCOPIC NOT DONE ON URINES WITH NEGATIVE PROTEIN, BLOOD, LEUKOCYTES, NITRITE, OR GLUCOSE <1000 mg/dL.  URINE CULTURE     Status: None   Collection Time    07/06/14  3:09 PM      Result Value Ref Range   Specimen Description URINE, CLEAN CATCH     Special Requests NONE     Culture  Setup Time       Value: 07/06/2014 16:35     Performed at [facility] PENDING     Culture       Value: Culture reincubated for better growth     Performed at [facility]   Report Status PENDING    BASIC METABOLIC PANEL     Status: Abnormal   Collection Time    07/07/14  6:25 AM      Result Value Ref Range   Sodium 137  137 - 147 mEq/L   Potassium 3.8  3.7 - 5.3 mEq/L   Chloride 101  96 - 112 mEq/L   CO2 26  19 - 32 mEq/L   Glucose, Bld 104 (*) 70 - 99 mg/dL   BUN 8  6 - 23 mg/dL   Creatinine, Ser 0.86  0.50 - 1.10 mg/dL   Calcium 8.1 (*) 8.4 - 10.5 mg/dL   GFR calc non Af Amer 71 (*) >90 mL/min   GFR calc Af Amer 83 (*) >90 mL/min   Comment: (NOTE)     The eGFR has been calculated using the CKD EPI equation.     This  calculation has not been validated in all clinical situations.     eGFR's persistently <90 mL/min signify possible Chronic Kidney     Disease.   Anion gap 10  5 - 15  CBC     Status: None   Collection Time    07/07/14  6:25 AM      Result Value Ref Range   WBC 10.2  4.0 - 10.5 K/uL   RBC 4.23  3.87 - 5.11 MIL/uL   Hemoglobin 12.8  12.0 - 15.0 g/dL   HCT 36.7  36.0 - 46.0 %   MCV 86.8  78.0 - 100.0 fL   MCH 30.3  26.0 - 34.0 pg   MCHC 34.9  30.0 - 36.0 g/dL   RDW 13.9  11.5 - 15.5 %   Platelets 183  150 - 400 K/uL    Imaging / Studies: Ct Abdomen Pelvis W Contrast  07/06/2014   CLINICAL DATA:  Severe acute left lower quadrant pain, Initial visit  EXAM: CT ABDOMEN AND PELVIS WITH CONTRAST  TECHNIQUE: Multidetector CT  imaging of the abdomen and pelvis was performed using the standard protocol following bolus administration of intravenous contrast.  CONTRAST:  177mL OMNIPAQUE IOHEXOL 300 MG/ML  SOLN  COMPARISON:  None.  FINDINGS: Visualized lung bases are clear. Bone windows reveal no acute osseous abnormalities.  There is a low-attenuation 1 cm focus in the right lobe of the liver on the initial series which is not seen well on the delayed images. Liver is otherwise normal. There is a small splenule. The spleen is otherwise normal. Pancreas and adrenal glands are normal. Kidneys are normal. Aorta is normal.  There is a small hiatal hernia. The stomach is normal. Small bowel is normal. Bladder is normal. Reproductive organs are normal.  There is significant diverticulosis involving the sigmoid colon. There is wall thickening of the mid sigmoid colon seen in the left side of the pelvis. Along the left lateral border of the sigmoid colon there is mild inflammatory change in the mesenteric fat. There is a tiny focus of gas extending into the left adnexa on image number 64. Just cranial to the sigmoid colon, there is a multi loculated focus of extraluminal gas measuring 48 x 18 by 42 mm.  IMPRESSION: Evidence of diverticulitis. There is also evidence of perforation. There is a 5 cm focus of gas contiguous and cranial to the sigmoid colon which likely represents a contained perforation. There is also a tiny focus of extraluminal gas in the left adnexa to the left of the sigmoid colon.   Electronically Signed   By: Skipper Cliche M.D.   On: 07/06/2014 16:38    Medications / Allergies:  Scheduled Meds: . enoxaparin (LOVENOX) injection  40 mg Subcutaneous Q24H  . pantoprazole (PROTONIX) IV  40 mg Intravenous QHS  . piperacillin-tazobactam (ZOSYN)  IV  3.375 g Intravenous Q8H   Continuous Infusions: . sodium chloride 125 mL/hr at 07/06/14 1534  . sodium chloride 125 mL/hr at 07/08/14 0553   PRN Meds:.acetaminophen,  acetaminophen, HYDROmorphone (DILAUDID) injection, iohexol, ondansetron  Antibiotics: Anti-infectives   Start     Dose/Rate Route Frequency Ordered Stop   07/06/14 2200  piperacillin-tazobactam (ZOSYN) IVPB 3.375 g     3.375 g 12.5 mL/hr over 240 Minutes Intravenous Every 8 hours 07/06/14 2121        Assessment/Plan  Diverticulitis with contained perforation  -continue with bowel rest given unchanged, perhaps worsened abdominal tenderness -IVF  -zosyn  -SCD/lovenox  -pain control  -  mobilize  -colonoscopy is UTD  -discussed with IR regarding perc drain on 10/5, no safe window to drain.  Recommend Atbx and repeat CT in a few days if no improvement.  Will also consider change in antibiotics if no improvement.  Pt understands if she worsens or fails conservative management she will require a hartmans.  Repeat labs in AM   Erby Pian, [facility] Surgery Pager [phone]) For consults and floor pages call [phone](7A-4:30P)  07/08/2014 7:53 AM

## 2014-07-09 LAB — CBC
HCT: 35.8 % — ABNORMAL LOW (ref 36.0–46.0)
HEMOGLOBIN: 12.2 g/dL (ref 12.0–15.0)
MCH: 29.7 pg (ref 26.0–34.0)
MCHC: 34.1 g/dL (ref 30.0–36.0)
MCV: 87.1 fL (ref 78.0–100.0)
PLATELETS: 194 10*3/uL (ref 150–400)
RBC: 4.11 MIL/uL (ref 3.87–5.11)
RDW: 13.8 % (ref 11.5–15.5)
WBC: 7.5 10*3/uL (ref 4.0–10.5)

## 2014-07-09 LAB — BASIC METABOLIC PANEL
Anion gap: 9 (ref 5–15)
BUN: 4 mg/dL — ABNORMAL LOW (ref 6–23)
CALCIUM: 8.6 mg/dL (ref 8.4–10.5)
CO2: 28 mEq/L (ref 19–32)
Chloride: 103 mEq/L (ref 96–112)
Creatinine, Ser: 0.76 mg/dL (ref 0.50–1.10)
GFR, EST NON AFRICAN AMERICAN: 89 mL/min — AB (ref >90)
Glucose, Bld: 137 mg/dL — ABNORMAL HIGH (ref 70–99)
Potassium: 3.3 mEq/L — ABNORMAL LOW (ref 3.7–5.3)
SODIUM: 140 meq/L (ref 137–147)

## 2014-07-09 MED ORDER — FENTANYL CITRATE 0.05 MG/ML IJ SOLN
12.5000 ug | INTRAMUSCULAR | Status: DC | PRN
  Administered 2014-07-09: 50 ug via INTRAVENOUS
  Administered 2014-07-10: 25 ug via INTRAVENOUS
  Administered 2014-07-12 – 2014-07-13 (×5): 50 ug via INTRAVENOUS
  Administered 2014-07-14: 25 ug via INTRAVENOUS
  Filled 2014-07-09 (×8): qty 2

## 2014-07-09 MED ORDER — POTASSIUM CHLORIDE 10 MEQ/100ML IV SOLN
10.0000 meq | INTRAVENOUS | Status: AC
  Administered 2014-07-09 (×3): 10 meq via INTRAVENOUS
  Filled 2014-07-09 (×3): qty 100

## 2014-07-09 NOTE — Progress Notes (Signed)
[facility] Progress Note     Subjective: Pt very concerned and frustrated about her not improving.  She has a lot of questions about why she  Isn't on stool softeners because she thinks she's getting a bowel obstruction.  She says her pain has changed and is now in the lower middle/right side.  Her LLQ pain has improved.  No vomiting, but having dry heaving, no fevers.  Not ambulating much.  Objective: Vital signs in last 24 hours: Temp:  [97.9 F (36.6 C)-99.3 F (37.4 C)] 99.3 F (37.4 C) (10/07 0500) Pulse Rate:  [68-71] 71 (10/07 0500) Resp:  [16-17] 17 (10/07 0500) BP: (136-152)/(72-80) 146/80 mmHg (10/07 0500) SpO2:  [96 %-98 %] 96 % (10/07 0500) Last BM Date: 07/08/14  Intake/Output from previous day: 10/06 0701 - 10/07 0700 In: 2751 [P.O.:1; I.V.:2750] Out: -  Intake/Output this shift:    PE: Gen:  Alert, NAD, pleasant Abd: Soft, tender in the RLQ and suprapubic, less so in LLQ, +BS, no HSM   Lab Results:   Recent Labs  07/07/14 0625 07/09/14 0637  WBC 10.2 7.5  HGB 12.8 12.2  HCT 36.7 35.8*  PLT 183 194   BMET  Recent Labs  07/07/14 0625 07/09/14 0637  NA 137 140  K 3.8 3.3*  CL 101 103  CO2 26 28  GLUCOSE 104* 137*  BUN 8 4*  CREATININE 0.86 0.76  CALCIUM 8.1* 8.6   PT/INR No results found for this basename: LABPROT, INR,  in the last 72 hours CMP     Component Value Date/Time   NA 140 07/09/2014 0637   K 3.3* 07/09/2014 0637   CL 103 07/09/2014 0637   CO2 28 07/09/2014 0637   GLUCOSE 137* 07/09/2014 0637   BUN 4* 07/09/2014 0637   CREATININE 0.76 07/09/2014 0637   CREATININE 0.90 06/04/2014 0845   CALCIUM 8.6 07/09/2014 0637   PROT 7.0 07/06/2014 1435   ALBUMIN 4.1 07/06/2014 1435   AST 28 07/06/2014 1435   ALT 21 07/06/2014 1435   ALKPHOS 63 07/06/2014 1435   BILITOT 0.5 07/06/2014 1435   GFRNONAA 89* 07/09/2014 0637   GFRAA >90 07/09/2014 0637   Lipase  No results found for this basename: lipase       Studies/Results: No  results found.  Anti-infectives: Anti-infectives   Start     Dose/Rate Route Frequency Ordered Stop   07/06/14 2200  piperacillin-tazobactam (ZOSYN) IVPB 3.375 g     3.375 g 12.5 mL/hr over 240 Minutes Intravenous Every 8 hours 07/06/14 2121         Assessment/Plan Diverticulitis with contained perforation  Hypokalemia - suppmented -WBC normal, afebrile, pain continues, continue with bowel rest given unchanged -IVF  -Zosyn Day #4 consider switching to Invanz -SCD/lovenox  -pain control  -mobilize and IS -colonoscopy is UTD  -discussed with IR regarding perc drain on 10/5, no safe window to drain. Recommend Atbx and repeat CT around 5-7 days after the last CT.  Pt understands if she worsens or fails conservative management she will require a Hartmans with colostomy this admission. -Repeat labs in AM -d/c phenergan -okay to have tylenol with a sip of water    LOS: 3 days    DORT, Christia Coaxum 07/09/2014, 10:40 AM Pager: [phone]

## 2014-07-10 LAB — BASIC METABOLIC PANEL
Anion gap: 14 (ref 5–15)
BUN: 4 mg/dL — ABNORMAL LOW (ref 6–23)
CALCIUM: 9.1 mg/dL (ref 8.4–10.5)
CO2: 27 mEq/L (ref 19–32)
Chloride: 98 mEq/L (ref 96–112)
Creatinine, Ser: 0.8 mg/dL (ref 0.50–1.10)
GFR calc Af Amer: >90 mL/min (ref >90)
GFR, EST NON AFRICAN AMERICAN: 78 mL/min — AB (ref >90)
GLUCOSE: 127 mg/dL — AB (ref 70–99)
Potassium: 2.9 mEq/L — CL (ref 3.7–5.3)
SODIUM: 139 meq/L (ref 137–147)

## 2014-07-10 MED ORDER — POTASSIUM CHLORIDE CRYS ER 20 MEQ PO TBCR
40.0000 meq | EXTENDED_RELEASE_TABLET | Freq: Two times a day (BID) | ORAL | Status: AC
  Administered 2014-07-10 (×2): 40 meq via ORAL
  Filled 2014-07-10 (×2): qty 2

## 2014-07-10 MED ORDER — POTASSIUM CHLORIDE 10 MEQ/100ML IV SOLN: 10.0000 meq | Freq: Once | INTRAVENOUS | Status: AC

## 2014-07-10 MED ORDER — POTASSIUM CHLORIDE 10 MEQ/100ML IV SOLN
10.0000 meq | INTRAVENOUS | Status: DC
  Administered 2014-07-10: 10 meq via INTRAVENOUS
  Filled 2014-07-10: qty 100

## 2014-07-10 MED ORDER — POTASSIUM CHLORIDE 2 MEQ/ML IV SOLN
INTRAVENOUS | Status: DC
  Administered 2014-07-10 – 2014-07-19 (×15): via INTRAVENOUS
  Filled 2014-07-10 (×23): qty 1000

## 2014-07-10 NOTE — Progress Notes (Signed)
Seen and agree  

## 2014-07-10 NOTE — Progress Notes (Signed)
CRITICAL VALUE ALERT  Critical value received: K level 2.9  Date of notification: 07/10/14  Time of notification:  0653  Critical value read back:yes  Nurse who received alert:L. Dionicia Abler  MD notified (1st page): Dr Hulen Skains  Time of first PJKD:[id number]  MD notified (2nd page):  Time of second page:  Responding MD:  Saverio Danker  Time MD [id number]

## 2014-07-10 NOTE — Progress Notes (Signed)
Patient ID: Katherine Hopkins, female   DOB: 04/25/1951, 63 y.o.   MRN: [id number]    Subjective: Pt feels much better today.  Pain is improved.  Passing some gas, nausea resolving  Objective: Vital signs in last 24 hours: Temp:  [98.2 F (36.8 C)-98.8 F (37.1 C)] 98.8 F (37.1 C) (10/08 0557) Pulse Rate:  [60-77] 60 (10/08 0557) Resp:  [17-18] 18 (10/08 0557) BP: (118-138)/(70-88) 133/74 mmHg (10/08 0557) SpO2:  [96 %-100 %] 96 % (10/08 0557) Last BM Date: 07/08/14  Intake/Output from previous day: 10/07 0701 - 10/08 0700 In: 3563.3 [P.O.:230; I.V.:3033.3; IV Piggyback:300] Out: -  Intake/Output this shift:    PE: Abd: soft, moderately tender in suprapubic region, minimal in LLQ, +BS, ND Heart: regular Lungs: CTAB  Lab Results:   Recent Labs  07/09/14 0637  WBC 7.5  HGB 12.2  HCT 35.8*  PLT 194   BMET  Recent Labs  07/09/14 0637 07/10/14 0450  NA 140 139  K 3.3* 2.9*  CL 103 98  CO2 28 27  GLUCOSE 137* 127*  BUN 4* 4*  CREATININE 0.76 0.80  CALCIUM 8.6 9.1   PT/INR No results found for this basename: LABPROT, INR,  in the last 72 hours CMP     Component Value Date/Time   NA 139 07/10/2014 0450   K 2.9* 07/10/2014 0450   CL 98 07/10/2014 0450   CO2 27 07/10/2014 0450   GLUCOSE 127* 07/10/2014 0450   BUN 4* 07/10/2014 0450   CREATININE 0.80 07/10/2014 0450   CREATININE 0.90 06/04/2014 0845   CALCIUM 9.1 07/10/2014 0450   PROT 7.0 07/06/2014 1435   ALBUMIN 4.1 07/06/2014 1435   AST 28 07/06/2014 1435   ALT 21 07/06/2014 1435   ALKPHOS 63 07/06/2014 1435   BILITOT 0.5 07/06/2014 1435   GFRNONAA 78* 07/10/2014 0450   GFRAA >90 07/10/2014 0450   Lipase  No results found for this basename: lipase       Studies/Results: No results found.  Anti-infectives: Anti-infectives   Start     Dose/Rate Route Frequency Ordered Stop   07/06/14 2200  piperacillin-tazobactam (ZOSYN) IVPB 3.375 g     3.375 g 12.5 mL/hr over 240 Minutes Intravenous Every 8 hours  07/06/14 2121         Assessment/Plan  1. diverticulitis with contained microperforation 2. Hypokalemia  Plan: 1. Replace K today, bmet in am 2. Give the patient clear liquids today and keep her on IV abx therapy.  WBC normal yesterday.  Hopefully, if she does well with clears today we can start to transition to oral abx therapy and advance diet tomorrow.  LOS: 4 days    Manvir Prabhu E 07/10/2014, 8:35 AM Pager: [phone]

## 2014-07-11 LAB — CBC
HCT: 39.5 % (ref 36.0–46.0)
Hemoglobin: 13.9 g/dL (ref 12.0–15.0)
MCH: 30.5 pg (ref 26.0–34.0)
MCHC: 35.2 g/dL (ref 30.0–36.0)
MCV: 86.6 fL (ref 78.0–100.0)
PLATELETS: 254 10*3/uL (ref 150–400)
RBC: 4.56 MIL/uL (ref 3.87–5.11)
RDW: 13.6 % (ref 11.5–15.5)
WBC: 6.9 10*3/uL (ref 4.0–10.5)

## 2014-07-11 LAB — BASIC METABOLIC PANEL
Anion gap: 16 — ABNORMAL HIGH (ref 5–15)
BUN: 5 mg/dL — ABNORMAL LOW (ref 6–23)
CALCIUM: 9.4 mg/dL (ref 8.4–10.5)
CO2: 24 mEq/L (ref 19–32)
Chloride: 101 mEq/L (ref 96–112)
Creatinine, Ser: 0.82 mg/dL (ref 0.50–1.10)
GFR calc Af Amer: 88 mL/min — ABNORMAL LOW (ref >90)
GFR calc non Af Amer: 76 mL/min — ABNORMAL LOW (ref >90)
GLUCOSE: 104 mg/dL — AB (ref 70–99)
Potassium: 4.3 mEq/L (ref 3.7–5.3)
SODIUM: 141 meq/L (ref 137–147)

## 2014-07-11 MED ORDER — AMOXICILLIN-POT CLAVULANATE 875-125 MG PO TABS
1.0000 | ORAL_TABLET | Freq: Two times a day (BID) | ORAL | Status: DC
  Administered 2014-07-11 – 2014-07-12 (×2): 1 via ORAL
  Filled 2014-07-11 (×3): qty 1

## 2014-07-11 MED ORDER — PANTOPRAZOLE SODIUM 40 MG PO TBEC
40.0000 mg | DELAYED_RELEASE_TABLET | Freq: Every day | ORAL | Status: DC
  Administered 2014-07-11 – 2014-07-18 (×8): 40 mg via ORAL
  Filled 2014-07-11 (×8): qty 1

## 2014-07-11 NOTE — Progress Notes (Signed)
Patient ID: Katherine Hopkins, female   DOB: 12/20/1950, 63 y.o.   MRN: [id number]    Subjective: Pt c/o crampy abdominal pain over night.  Better this morning.  Tolerated her clear liquids well.  Objective: Vital signs in last 24 hours: Temp:  [98 F (36.7 C)-98.6 F (37 C)] 98 F (36.7 C) (10/09 0555) Pulse Rate:  [61-64] 61 (10/09 0555) Resp:  [16] 16 (10/09 0555) BP: (122-143)/(70-82) 135/73 mmHg (10/09 0555) SpO2:  [97 %-99 %] 99 % (10/09 0555) Last BM Date: 07/10/14  Intake/Output from previous day: 10/08 0701 - 10/09 0700 In: 3195 [P.O.:1560; I.V.:1485; IV Piggyback:150] Out: -  Intake/Output this shift:    PE: Abd: soft, still mildly tender in LLQ and suprapubic region Heart: regular Lungs: CTAB  Lab Results:   Recent Labs  07/09/14 0637  WBC 7.5  HGB 12.2  HCT 35.8*  PLT 194   BMET  Recent Labs  07/10/14 0450 07/11/14 0441  NA 139 141  K 2.9* 4.3  CL 98 101  CO2 27 24  GLUCOSE 127* 104*  BUN 4* 5*  CREATININE 0.80 0.82  CALCIUM 9.1 9.4   PT/INR No results found for this basename: LABPROT, INR,  in the last 72 hours CMP     Component Value Date/Time   NA 141 07/11/2014 0441   K 4.3 07/11/2014 0441   CL 101 07/11/2014 0441   CO2 24 07/11/2014 0441   GLUCOSE 104* 07/11/2014 0441   BUN 5* 07/11/2014 0441   CREATININE 0.82 07/11/2014 0441   CREATININE 0.90 06/04/2014 0845   CALCIUM 9.4 07/11/2014 0441   PROT 7.0 07/06/2014 1435   ALBUMIN 4.1 07/06/2014 1435   AST 28 07/06/2014 1435   ALT 21 07/06/2014 1435   ALKPHOS 63 07/06/2014 1435   BILITOT 0.5 07/06/2014 1435   GFRNONAA 76* 07/11/2014 0441   GFRAA 88* 07/11/2014 0441   Lipase  No results found for this basename: lipase       Studies/Results: No results found.  Anti-infectives: Anti-infectives   Start     Dose/Rate Route Frequency Ordered Stop   07/06/14 2200  piperacillin-tazobactam (ZOSYN) IVPB 3.375 g     3.375 g 12.5 mL/hr over 240 Minutes Intravenous Every 8 hours 07/06/14 2121          Assessment/Plan  1. Diverticulitis with mircoperforation  2. Hypokalemia, resolved  Plan: 1. Will try full liquids today and see how she does.  I will repeat a CBC to make sure with the new onset cramping that her WBC did not increase.  If it did, her diet may need to be backed down.  If her WBC remains normal, I will likely transition her to oral abx therapy with hopes of home tomorrow if she tolerates low fiber diet.   LOS: 5 days    Toyia Jelinek E 07/11/2014, 8:33 AM Pager: [phone]

## 2014-07-12 ENCOUNTER — Inpatient Hospital Stay (HOSPITAL_COMMUNITY): Payer: BC Managed Care – PPO

## 2014-07-12 MED ORDER — PIPERACILLIN-TAZOBACTAM 3.375 G IVPB
3.3750 g | Freq: Three times a day (TID) | INTRAVENOUS | Status: DC
  Administered 2014-07-12 – 2014-07-14 (×6): 3.375 g via INTRAVENOUS
  Filled 2014-07-12 (×8): qty 50

## 2014-07-12 MED ORDER — IOHEXOL 300 MG/ML  SOLN
25.0000 mL | INTRAMUSCULAR | Status: AC
Start: 2014-07-12 — End: 2014-07-12
  Administered 2014-07-12: 25 mL via ORAL

## 2014-07-12 MED ORDER — IOHEXOL 300 MG/ML  SOLN
100.0000 mL | Freq: Once | INTRAMUSCULAR | Status: AC | PRN
  Administered 2014-07-12: 100 mL via INTRAVENOUS

## 2014-07-12 NOTE — Progress Notes (Signed)
Patient ID: Katherine Hopkins, female   DOB: May 10, 1951, 63 y.o.   MRN: [id number]    Subjective: Pt c/o worsening pain today.  Passing some liquid stool, but not much  Objective: Vital signs in last 24 hours: Temp:  [98.1 F (36.7 C)-98.4 F (36.9 C)] 98.1 F (36.7 C) (10/10 0541) Pulse Rate:  [64-82] 64 (10/10 0541) Resp:  [15-17] 17 (10/10 0541) BP: (114-145)/(70-95) 120/71 mmHg (10/10 0541) SpO2:  [96 %-100 %] 100 % (10/10 0541) Last BM Date: 07/12/14  Intake/Output from previous day: 10/09 0701 - 10/10 0700 In: 1300 [P.O.:1300] Out: -  Intake/Output this shift:    PE: Gen: NAD Heart: regular Lungs: CTAB Abd: soft, but much more tender today on the left side of her abdomen, still with some bowel sounds, ND  Lab Results:   Recent Labs  07/11/14 1020  WBC 6.9  HGB 13.9  HCT 39.5  PLT 254   BMET  Recent Labs  07/10/14 0450 07/11/14 0441  NA 139 141  K 2.9* 4.3  CL 98 101  CO2 27 24  GLUCOSE 127* 104*  BUN 4* 5*  CREATININE 0.80 0.82  CALCIUM 9.1 9.4   PT/INR No results found for this basename: LABPROT, INR,  in the last 72 hours CMP     Component Value Date/Time   NA 141 07/11/2014 0441   K 4.3 07/11/2014 0441   CL 101 07/11/2014 0441   CO2 24 07/11/2014 0441   GLUCOSE 104* 07/11/2014 0441   BUN 5* 07/11/2014 0441   CREATININE 0.82 07/11/2014 0441   CREATININE 0.90 06/04/2014 0845   CALCIUM 9.4 07/11/2014 0441   PROT 7.0 07/06/2014 1435   ALBUMIN 4.1 07/06/2014 1435   AST 28 07/06/2014 1435   ALT 21 07/06/2014 1435   ALKPHOS 63 07/06/2014 1435   BILITOT 0.5 07/06/2014 1435   GFRNONAA 76* 07/11/2014 0441   GFRAA 88* 07/11/2014 0441   Lipase  No results found for this basename: lipase       Studies/Results: No results found.  Anti-infectives: Anti-infectives   Start     Dose/Rate Route Frequency Ordered Stop   07/12/14 1400  piperacillin-tazobactam (ZOSYN) IVPB 3.375 g     3.375 g 12.5 mL/hr over 240 Minutes Intravenous 3 times per day 07/12/14  1013     07/11/14 1530  amoxicillin-clavulanate (AUGMENTIN) 875-125 MG per tablet 1 tablet  Status:  Discontinued     1 tablet Oral Every 12 hours 07/11/14 1430 07/12/14 1013   07/06/14 2200  piperacillin-tazobactam (ZOSYN) IVPB 3.375 g  Status:  Discontinued     3.375 g 12.5 mL/hr over 240 Minutes Intravenous Every 8 hours 07/06/14 2121 07/11/14 1430       Assessment/Plan  1. Diverticulitis with microperforation with a 5 cm focus of gas  Plan: 1. Patient's pain is quite a bit worse today.  I will decrease her back to clear liquids and restart her zosyn.  I will order a repeat CT scan as she has not been scanned in 6 days. 2. Recheck labs in the am.  LOS: 6 days    Jaythan Hinely E 07/12/2014, 12:02 PM Pager: [phone]

## 2014-07-12 NOTE — Plan of Care (Signed)
Problem: Phase I Progression Outcomes Goal: Pain controlled with appropriate interventions Outcome: Not Progressing Pts pain worsened again today.

## 2014-07-12 NOTE — Progress Notes (Signed)
Back to IV abx. Bowel rest.  Imogene Burn. Georgette Dover, MD, [facility]  General/ Trauma Surgery  07/12/2014 12:41 PM

## 2014-07-13 ENCOUNTER — Encounter (HOSPITAL_COMMUNITY): Payer: Self-pay | Admitting: Radiology

## 2014-07-13 LAB — COMPREHENSIVE METABOLIC PANEL
ALT: 17 U/L (ref 0–35)
AST: 17 U/L (ref 0–37)
Albumin: 3.3 g/dL — ABNORMAL LOW (ref 3.5–5.2)
Alkaline Phosphatase: 78 U/L (ref 39–117)
Anion gap: 17 — ABNORMAL HIGH (ref 5–15)
BUN: 7 mg/dL (ref 6–23)
CALCIUM: 9.5 mg/dL (ref 8.4–10.5)
CO2: 22 meq/L (ref 19–32)
Chloride: 96 mEq/L (ref 96–112)
Creatinine, Ser: 0.97 mg/dL (ref 0.50–1.10)
GFR calc Af Amer: 72 mL/min — ABNORMAL LOW (ref >90)
GFR, EST NON AFRICAN AMERICAN: 62 mL/min — AB (ref >90)
Glucose, Bld: 131 mg/dL — ABNORMAL HIGH (ref 70–99)
POTASSIUM: 4 meq/L (ref 3.7–5.3)
SODIUM: 135 meq/L — AB (ref 137–147)
Total Bilirubin: 0.8 mg/dL (ref 0.3–1.2)
Total Protein: 7.5 g/dL (ref 6.0–8.3)

## 2014-07-13 LAB — CBC
HCT: 39.3 % (ref 36.0–46.0)
Hemoglobin: 13.9 g/dL (ref 12.0–15.0)
MCH: 30.2 pg (ref 26.0–34.0)
MCHC: 35.4 g/dL (ref 30.0–36.0)
MCV: 85.2 fL (ref 78.0–100.0)
PLATELETS: 347 10*3/uL (ref 150–400)
RBC: 4.61 MIL/uL (ref 3.87–5.11)
RDW: 13.5 % (ref 11.5–15.5)
WBC: 14.4 10*3/uL — ABNORMAL HIGH (ref 4.0–10.5)

## 2014-07-13 NOTE — Assessment & Plan Note (Signed)
Pericolonic abscess-IR requested to attempt drainage

## 2014-07-13 NOTE — H&P (Signed)
Referring Physician(s): Dr. Kaylyn Lim  Subjective: Pt is 63yo WF admitted about a week ago with diverticulitis and contained microperforation. She has not made much progress with conservative management and repeat CT was done yesterday. This shows microperforation to be more fluid collection than air. IR is requested to eval for perc drainage. Chart, PMHx, meds, labs, imaging reviewed  Past Medical History  Diagnosis Date  . Bulging disc    Past Surgical History  Procedure Laterality Date  . Appendectomy    . Knee surgery    . Foot surgery    . Elbow surgery     History   Social History  . Marital Status: Married    Spouse Name: N/A    Number of Children: N/A  . Years of Education: N/A   Occupational History  . Not on file.   Social History Main Topics  . Smoking status: Never Smoker   . Smokeless tobacco: Not on file  . Alcohol Use: No  . Drug Use: Not on file  . Sexual Activity: Not on file   Other Topics Concern  . Not on file   Social History Narrative  . No narrative on file    Allergies: Betadine and Codeine  Medications: Prior to Admission medications   Medication Sig Start Date End Date Taking? Authorizing Provider  calcium carbonate (OS-CAL) 600 MG TABS tablet Take 600 mg by mouth 2 (two) times daily with a meal.   Yes Historical Provider, MD  cholecalciferol (VITAMIN D) 1000 UNITS tablet Take 1,000 Units by mouth daily.   Yes Historical Provider, MD  glucosamine-chondroitin 500-400 MG tablet Take 1 tablet by mouth 3 (three) times daily.   Yes Historical Provider, MD  Omega-3 Fatty Acids (FISH OIL) 1000 MG CAPS Take 1 capsule by mouth daily.    Yes Historical Provider, MD    Review of Systems  Constitutional: Positive for unexpected weight change. Negative for fever and chills.  Respiratory: Negative.   Cardiovascular: Negative.   Gastrointestinal: Positive for abdominal pain. Negative for nausea, vomiting and blood in stool.       LLQ pain   Genitourinary: Negative.  Negative for difficulty urinating.  Skin: Negative.   Neurological: Negative.   Psychiatric/Behavioral: Negative.     Vital Signs: BP 113/80  Pulse 70  Temp(Src) 97.9 F (36.6 C) (Oral)  Resp 17  Ht 5' 6.5" (1.689 m)  Wt 153 lb 3.5 oz (69.5 kg)  BMI 24.36 kg/m2  SpO2 99%  Physical Exam  Constitutional: She is oriented to person, place, and time. She appears well-developed. No distress.  HENT:  Head: Normocephalic.  Neck: Normal range of motion. No tracheal deviation present.  Cardiovascular: Normal rate, regular rhythm and normal heart sounds.   No murmur heard. Pulmonary/Chest: Effort normal and breath sounds normal. No respiratory distress.  Abdominal: Soft. Bowel sounds are normal. She exhibits no distension.  Neurological: She is alert and oriented to person, place, and time. No cranial nerve deficit.  Psychiatric: She has a normal mood and affect. Judgment normal.    Imaging: Ct Abdomen Pelvis W Contrast  07/12/2014   CLINICAL DATA:  Diverticulitis, on antibiotic treatment for 1 week, increased in left lower quadrant pain  EXAM: CT ABDOMEN AND PELVIS WITH CONTRAST  TECHNIQUE: Multidetector CT imaging of the abdomen and pelvis was performed using the standard protocol following bolus administration of intravenous contrast.  CONTRAST:  130mL OMNIPAQUE IOHEXOL 300 MG/ML  SOLN  COMPARISON:  07/06/2014  FINDINGS: Sagittal images of  the spine shows mild degenerative changes lumbar spine. Lung bases are unremarkable.  Liver, pancreas, spleen and adrenals are unremarkable. Abdominal aorta is unremarkable. No calcified gallstones are noted within gallbladder.  No small bowel obstruction.  No adenopathy. No pericecal inflammation. The terminal ileum is unremarkable.  There is a low lying cecum.  Again noted colonic diverticula in proximal sigmoid colon. There is worsening colitis or diverticulitis proximal sigmoid colon in left lower quadrant as seen in axial  image 56. There is focal thickening of colonic wall and stranding of surrounding fat. Again noted extraluminal air and there is a pericolonic abscess with a fluid component and gas component. Measures 3.6 x 3.5 cm. The gas component best seen in coronal image 42 measures about 3 cm. Additional tiny foci of extraluminal air noted more proximally in sigmoid colon see coronal image 26.  Retroflexed uterus. No adnexal mass. The urinary bladder is unremarkable. No inguinal adenopathy.  IMPRESSION: 1. There is worsening diverticulitis in proximal sigmoid colon. Axial image 58 there is thickening of colonic wall in proximal sigmoid colon. Two tiny new foci of extraluminal air are suspected. Again noted extraluminal air and pericolonic abscess is noted now with a fluid component and gas component. The fluid component measures 3.6 x 3.5 cm. The gas component measures about 3 cm best seen in coronal image 42. 2. No hydronephrosis or hydroureter. 3. There is a low lying cecum without pericecal inflammation. 4. Retroflexed uterus.   Electronically Signed   By: Lahoma Crocker M.D.   On: 07/12/2014 15:13    Labs:  CBC:  Recent Labs  07/07/14 0625 07/09/14 0637 07/11/14 1020 07/13/14 0450  WBC 10.2 7.5 6.9 14.4*  HGB 12.8 12.2 13.9 13.9  HCT 36.7 35.8* 39.5 39.3  PLT 183 194 254 347    COAGS:   BMP:  Recent Labs  07/09/14 0637 07/10/14 0450 07/11/14 0441 07/13/14 0450  NA 140 139 141 135*  K 3.3* 2.9* 4.3 4.0  CL 103 98 101 96  CO2 28 27 24 22   GLUCOSE 137* 127* 104* 131*  BUN 4* 4* 5* 7  CALCIUM 8.6 9.1 9.4 9.5  CREATININE 0.76 0.80 0.82 0.97  GFRNONAA 89* 78* 76* 62*  GFRAA >90 >90 88* 72*    LIVER FUNCTION TESTS:  Recent Labs  06/04/14 0845 07/06/14 1435 07/13/14 0450  BILITOT 0.8 0.5 0.8  AST 23 28 17   ALT 16 21 17   ALKPHOS 59 63 78  PROT 7.2 7.0 7.5  ALBUMIN 4.7 4.1 3.3*    Assessment and Plan: Diverticulitis with LLQ/pelvic abscess Imaging reviewed by Dr.  Kathlene Cote. Location of abscess presents a challenge for percutaneous access. There is not much of a safe window for access. Possibly transgluteal approach. IR can offer to bring pt to CT and scan in multiple positions to see if there is safe perc access window. If so, then we may proceed with attempt at drain placement Discussed this as well as procedure risks, complications, use of sedation with pt. Labs reviewed. Hold Lovenox tonight, check am coags. NPO p MN for procedure tomorrow Pt has signed consent in chart    I spent a total of 30 minutes face to face in clinical consultation/evaluation, greater than 50% of which was counseling/coordinating care for CT guided drainage of pelvic abscess.  SignedAscencion Dike 07/13/2014, 10:40 AM

## 2014-07-13 NOTE — H&P (Signed)
Imaging reviewed.  Tough percutaneous approach to left pelvic abscess.  Will assess by CT in different positions tomorrow to see if a percutaneous approach is available for abscess drainage.

## 2014-07-13 NOTE — Progress Notes (Signed)
Patient ID: Katherine Hopkins, female   DOB: Jul 07, 1951, 63 y.o.   MRN: [id number] [facility] Surgery Progress Note:   * No surgery found *  Subjective: Mental status is clear.  Discussed her frustrations with management yesterday.   Objective: Vital signs in last 24 hours: Temp:  [97.9 F (36.6 C)-98.4 F (36.9 C)] 97.9 F (36.6 C) (10/11 0605) Pulse Rate:  [70] 70 (10/10 1319) Resp:  [16-17] 17 (10/11 0605) BP: (113-151)/(80-87) 113/80 mmHg (10/11 0605) SpO2:  [99 %-100 %] 99 % (10/11 0605)  Intake/Output from previous day: 10/10 0701 - 10/11 0700 In: 1200 [I.V.:1200] Out: 200 [Urine:200] Intake/Output this shift:    Physical Exam: Work of breathing is not elevated.  Still with pain in LLQ as correlates with CT findings  Lab Results:  Results for orders placed during the hospital encounter of 07/06/14 (from the past 48 hour(s))  CBC     Status: None   Collection Time    07/11/14 10:20 AM      Result Value Ref Range   WBC 6.9  4.0 - 10.5 K/uL   RBC 4.56  3.87 - 5.11 MIL/uL   Hemoglobin 13.9  12.0 - 15.0 g/dL   HCT 39.5  36.0 - 46.0 %   MCV 86.6  78.0 - 100.0 fL   MCH 30.5  26.0 - 34.0 pg   MCHC 35.2  30.0 - 36.0 g/dL   RDW 13.6  11.5 - 15.5 %   Platelets 254  150 - 400 K/uL  CBC     Status: Abnormal   Collection Time    07/13/14  4:50 AM      Result Value Ref Range   WBC 14.4 (*) 4.0 - 10.5 K/uL   RBC 4.61  3.87 - 5.11 MIL/uL   Hemoglobin 13.9  12.0 - 15.0 g/dL   HCT 39.3  36.0 - 46.0 %   MCV 85.2  78.0 - 100.0 fL   MCH 30.2  26.0 - 34.0 pg   MCHC 35.4  30.0 - 36.0 g/dL   RDW 13.5  11.5 - 15.5 %   Platelets 347  150 - 400 K/uL   Comment: DELTA CHECK NOTED  COMPREHENSIVE METABOLIC PANEL     Status: Abnormal   Collection Time    07/13/14  4:50 AM      Result Value Ref Range   Sodium 135 (*) 137 - 147 mEq/L   Potassium 4.0  3.7 - 5.3 mEq/L   Chloride 96  96 - 112 mEq/L   CO2 22  19 - 32 mEq/L   Glucose, Bld 131 (*) 70 - 99 mg/dL   BUN 7  6 - 23 mg/dL    Creatinine, Ser 0.97  0.50 - 1.10 mg/dL   Calcium 9.5  8.4 - 10.5 mg/dL   Total Protein 7.5  6.0 - 8.3 g/dL   Albumin 3.3 (*) 3.5 - 5.2 g/dL   AST 17  0 - 37 U/L   ALT 17  0 - 35 U/L   Alkaline Phosphatase 78  39 - 117 U/L   Total Bilirubin 0.8  0.3 - 1.2 mg/dL   GFR calc non Af Amer 62 (*) >90 mL/min   GFR calc Af Amer 72 (*) >90 mL/min   Comment: (NOTE)     The eGFR has been calculated using the CKD EPI equation.     This calculation has not been validated in all clinical situations.     eGFR's persistently <90 mL/min signify  possible Chronic Kidney     Disease.   Anion gap 17 (*) 5 - 15    Radiology/Results: Ct Abdomen Pelvis W Contrast  07/12/2014   CLINICAL DATA:  Diverticulitis, on antibiotic treatment for 1 week, increased in left lower quadrant pain  EXAM: CT ABDOMEN AND PELVIS WITH CONTRAST  TECHNIQUE: Multidetector CT imaging of the abdomen and pelvis was performed using the standard protocol following bolus administration of intravenous contrast.  CONTRAST:  137mL OMNIPAQUE IOHEXOL 300 MG/ML  SOLN  COMPARISON:  07/06/2014  FINDINGS: Sagittal images of the spine shows mild degenerative changes lumbar spine. Lung bases are unremarkable.  Liver, pancreas, spleen and adrenals are unremarkable. Abdominal aorta is unremarkable. No calcified gallstones are noted within gallbladder.  No small bowel obstruction.  No adenopathy. No pericecal inflammation. The terminal ileum is unremarkable.  There is a low lying cecum.  Again noted colonic diverticula in proximal sigmoid colon. There is worsening colitis or diverticulitis proximal sigmoid colon in left lower quadrant as seen in axial image 56. There is focal thickening of colonic wall and stranding of surrounding fat. Again noted extraluminal air and there is a pericolonic abscess with a fluid component and gas component. Measures 3.6 x 3.5 cm. The gas component best seen in coronal image 42 measures about 3 cm. Additional tiny foci of  extraluminal air noted more proximally in sigmoid colon see coronal image 26.  Retroflexed uterus. No adnexal mass. The urinary bladder is unremarkable. No inguinal adenopathy.  IMPRESSION: 1. There is worsening diverticulitis in proximal sigmoid colon. Axial image 58 there is thickening of colonic wall in proximal sigmoid colon. Two tiny new foci of extraluminal air are suspected. Again noted extraluminal air and pericolonic abscess is noted now with a fluid component and gas component. The fluid component measures 3.6 x 3.5 cm. The gas component measures about 3 cm best seen in coronal image 42. 2. No hydronephrosis or hydroureter. 3. There is a low lying cecum without pericecal inflammation. 4. Retroflexed uterus.   Electronically Signed   By: Lahoma Crocker M.D.   On: 07/12/2014 15:13    Anti-infectives: Anti-infectives   Start     Dose/Rate Route Frequency Ordered Stop   07/12/14 1400  piperacillin-tazobactam (ZOSYN) IVPB 3.375 g     3.375 g 12.5 mL/hr over 240 Minutes Intravenous 3 times per day 07/12/14 1013     07/11/14 1530  amoxicillin-clavulanate (AUGMENTIN) 875-125 MG per tablet 1 tablet  Status:  Discontinued     1 tablet Oral Every 12 hours 07/11/14 1430 07/12/14 1013   07/06/14 2200  piperacillin-tazobactam (ZOSYN) IVPB 3.375 g  Status:  Discontinued     3.375 g 12.5 mL/hr over 240 Minutes Intravenous Every 8 hours 07/06/14 2121 07/11/14 1430      Assessment/Plan: Problem List: Patient Active Problem List   Diagnosis Date Noted  . Diverticulitis of colon 07/06/2014  . Cramp of both lower extremities 06/04/2014  . Menopause 05/30/2013  . H/O vitamin D deficiency 05/30/2013  . Large joint arthralgia of multiple sites 05/30/2013    Diverticulitis with abscess around the sigmoid.  Request for percutaneous drainage.  * No surgery found *    LOS: 7 days   Matt B. Hassell Done, MD, [facility] [phone] beeper [phone]  07/13/2014 9:41 AM

## 2014-07-14 ENCOUNTER — Inpatient Hospital Stay (HOSPITAL_COMMUNITY): Payer: BC Managed Care – PPO

## 2014-07-14 LAB — PROTIME-INR
INR: 1.07 (ref 0.00–1.49)
Prothrombin Time: 13.9 seconds (ref 11.6–15.2)

## 2014-07-14 MED ORDER — KETOROLAC TROMETHAMINE 15 MG/ML IJ SOLN
15.0000 mg | Freq: Three times a day (TID) | INTRAMUSCULAR | Status: AC | PRN
Start: 2014-07-14 — End: 2014-07-19
  Administered 2014-07-14 – 2014-07-17 (×6): 15 mg via INTRAVENOUS
  Filled 2014-07-14 (×6): qty 1

## 2014-07-14 MED ORDER — MIDAZOLAM HCL 2 MG/2ML IJ SOLN
INTRAMUSCULAR | Status: AC | PRN
  Administered 2014-07-14 (×2): 1 mg via INTRAVENOUS
  Administered 2014-07-14: 0.5 mg via INTRAVENOUS

## 2014-07-14 MED ORDER — FENTANYL CITRATE 0.05 MG/ML IJ SOLN
INTRAMUSCULAR | Status: AC
  Filled 2014-07-14: qty 4

## 2014-07-14 MED ORDER — LIDOCAINE HCL (PF) 1 % IJ SOLN
INTRAMUSCULAR | Status: AC
  Filled 2014-07-14: qty 30

## 2014-07-14 MED ORDER — SODIUM CHLORIDE 0.9 % IV SOLN
1.0000 g | INTRAVENOUS | Status: DC
  Administered 2014-07-14 – 2014-07-18 (×5): 1 g via INTRAVENOUS
  Filled 2014-07-14 (×6): qty 1

## 2014-07-14 MED ORDER — MIDAZOLAM HCL 2 MG/2ML IJ SOLN
INTRAMUSCULAR | Status: AC
  Filled 2014-07-14: qty 4

## 2014-07-14 MED ORDER — FENTANYL CITRATE 0.05 MG/ML IJ SOLN
INTRAMUSCULAR | Status: AC | PRN
  Administered 2014-07-14: 50 ug via INTRAVENOUS
  Administered 2014-07-14 (×2): 25 ug via INTRAVENOUS

## 2014-07-14 NOTE — Sedation Documentation (Signed)
Pt lifting head intermittently w/ manipulation of needle per MD.  He gave more lidocaine to area.

## 2014-07-14 NOTE — Sedation Documentation (Signed)
Dr. Laurence Ferrari at bedside speaking with pt and answering questions.

## 2014-07-14 NOTE — Sedation Documentation (Signed)
Pt c/o pain to site.  Discussed vitals w/ Dr. Laurence Ferrari and he ordered more meds.

## 2014-07-14 NOTE — Progress Notes (Signed)
Pt had multiple pin-point scratches on leg bilaterally and abrasion on right. Pt was unaware of the presence and/ or cause of scratches.

## 2014-07-14 NOTE — Progress Notes (Addendum)
General surgery attending:  I've interviewed and examined this patient. I agree with the assessment and treatment plan outlined by Ms. Riebock, NP.  Patient has been hospitalized since October 4. She underwent a drainage of abscess today. She has complicated diverticulitis, but no evidence of peritonitis whatsoever.  I spent a very long time today with the patient discussing pathophysiology of disease, algorithms for care. She is frustrated by her failure to respond to therapy.  I explained the different pathways including resolution with drainage allowing outpatient evaluation and possible one stage resection. I discussed what would happen if she did not respond, which might include a Hartmann resection with temporary colostomy. I offered her a second opinion and I offered transfer to [facility]. She declines at this time but remains frustrated.  Edsel Petrin. Dalbert Batman, M.D., [facility] General and Minimally invasive Surgery Breast and Colorectal Surgery Office:   [phone] Pager:   [phone]

## 2014-07-14 NOTE — Progress Notes (Signed)
Patient ID: Katherine Hopkins, female   DOB: 07-22-1951, 63 y.o.   MRN: [id number]     [address] [place], [place] [address]    Phone: [phone] FAX: [phone]     Subjective: No n/v.  Passing flatus.  VSS.  Afebrile.  Underwent perc drain placement this AM.    Objective:  Vital signs:  Filed Vitals:   07/14/14 0935 07/14/14 0957 07/14/14 1030 07/14/14 1100  BP: 144/77 147/75 142/82 156/82  Pulse: 60 62 61 63  Temp:  98 F (36.7 C) 97.4 F (36.3 C) 97.9 F (36.6 C)  TempSrc:      Resp: $Remo'14 18 20 16  'BEdGv$ Height:      Weight:      SpO2: 98% 98% 98% 98%    Last BM Date: 07/13/14  Intake/Output   Yesterday:  10/11 0701 - 10/12 0700 In: 1600 [I.V.:1600] Out: -  This shift:  Total I/O In: 107 [IV Piggyback:107] Out: -    Physical Exam: General: Pt awake/alert/oriented x4 in no acute distress Abdomen: Soft.  Nondistended.  TTP LLQ.  Left gluteal drain with sanguinous, purulent output.   No evidence of peritonitis.  No incarcerated hernias.    Problem List:   Active Problems:   Diverticulitis of colon    Results:   Labs: Results for orders placed during the hospital encounter of 07/06/14 (from the past 48 hour(s))  CBC     Status: Abnormal   Collection Time    07/13/14  4:50 AM      Result Value Ref Range   WBC 14.4 (*) 4.0 - 10.5 K/uL   RBC 4.61  3.87 - 5.11 MIL/uL   Hemoglobin 13.9  12.0 - 15.0 g/dL   HCT 39.3  36.0 - 46.0 %   MCV 85.2  78.0 - 100.0 fL   MCH 30.2  26.0 - 34.0 pg   MCHC 35.4  30.0 - 36.0 g/dL   RDW 13.5  11.5 - 15.5 %   Platelets 347  150 - 400 K/uL   Comment: DELTA CHECK NOTED  COMPREHENSIVE METABOLIC PANEL     Status: Abnormal   Collection Time    07/13/14  4:50 AM      Result Value Ref Range   Sodium 135 (*) 137 - 147 mEq/L   Potassium 4.0  3.7 - 5.3 mEq/L   Chloride 96  96 - 112 mEq/L   CO2 22  19 - 32 mEq/L   Glucose, Bld 131 (*) 70 - 99 mg/dL   BUN 7  6  - 23 mg/dL   Creatinine, Ser 0.97  0.50 - 1.10 mg/dL   Calcium 9.5  8.4 - 10.5 mg/dL   Total Protein 7.5  6.0 - 8.3 g/dL   Albumin 3.3 (*) 3.5 - 5.2 g/dL   AST 17  0 - 37 U/L   ALT 17  0 - 35 U/L   Alkaline Phosphatase 78  39 - 117 U/L   Total Bilirubin 0.8  0.3 - 1.2 mg/dL   GFR calc non Af Amer 62 (*) >90 mL/min   GFR calc Af Amer 72 (*) >90 mL/min   Comment: (NOTE)     The eGFR has been calculated using the CKD EPI equation.     This calculation has not been validated in all clinical situations.     eGFR's persistently <90 mL/min signify possible Chronic Kidney  Disease.   Anion gap 17 (*) 5 - 15  PROTIME-INR     Status: None   Collection Time    07/14/14  5:49 AM      Result Value Ref Range   Prothrombin Time 13.9  11.6 - 15.2 seconds   INR 1.07  0.00 - 1.49    Imaging / Studies: Ct Abdomen Pelvis W Contrast  07/12/2014   CLINICAL DATA:  Diverticulitis, on antibiotic treatment for 1 week, increased in left lower quadrant pain  EXAM: CT ABDOMEN AND PELVIS WITH CONTRAST  TECHNIQUE: Multidetector CT imaging of the abdomen and pelvis was performed using the standard protocol following bolus administration of intravenous contrast.  CONTRAST:  173mL OMNIPAQUE IOHEXOL 300 MG/ML  SOLN  COMPARISON:  07/06/2014  FINDINGS: Sagittal images of the spine shows mild degenerative changes lumbar spine. Lung bases are unremarkable.  Liver, pancreas, spleen and adrenals are unremarkable. Abdominal aorta is unremarkable. No calcified gallstones are noted within gallbladder.  No small bowel obstruction.  No adenopathy. No pericecal inflammation. The terminal ileum is unremarkable.  There is a low lying cecum.  Again noted colonic diverticula in proximal sigmoid colon. There is worsening colitis or diverticulitis proximal sigmoid colon in left lower quadrant as seen in axial image 56. There is focal thickening of colonic wall and stranding of surrounding fat. Again noted extraluminal air and there is a  pericolonic abscess with a fluid component and gas component. Measures 3.6 x 3.5 cm. The gas component best seen in coronal image 42 measures about 3 cm. Additional tiny foci of extraluminal air noted more proximally in sigmoid colon see coronal image 26.  Retroflexed uterus. No adnexal mass. The urinary bladder is unremarkable. No inguinal adenopathy.  IMPRESSION: 1. There is worsening diverticulitis in proximal sigmoid colon. Axial image 58 there is thickening of colonic wall in proximal sigmoid colon. Two tiny new foci of extraluminal air are suspected. Again noted extraluminal air and pericolonic abscess is noted now with a fluid component and gas component. The fluid component measures 3.6 x 3.5 cm. The gas component measures about 3 cm best seen in coronal image 42. 2. No hydronephrosis or hydroureter. 3. There is a low lying cecum without pericecal inflammation. 4. Retroflexed uterus.   Electronically Signed   By: Lahoma Crocker M.D.   On: 07/12/2014 15:13    Medications / Allergies:  Scheduled Meds: . enoxaparin (LOVENOX) injection  40 mg Subcutaneous Q24H  . fentaNYL      . lidocaine (PF)      . midazolam      . pantoprazole  40 mg Oral QHS  . piperacillin-tazobactam (ZOSYN)  IV  3.375 g Intravenous 3 times per day   Continuous Infusions: . dextrose 5 % and 0.45% NaCl 1,000 mL with potassium chloride 20 mEq infusion 100 mL/hr at 07/14/14 0505   PRN Meds:.acetaminophen, acetaminophen, fentaNYL, iohexol, ketorolac, ondansetron  Antibiotics: Anti-infectives   Start     Dose/Rate Route Frequency Ordered Stop   07/12/14 1400  piperacillin-tazobactam (ZOSYN) IVPB 3.375 g     3.375 g 12.5 mL/hr over 240 Minutes Intravenous 3 times per day 07/12/14 1013     07/11/14 1530  amoxicillin-clavulanate (AUGMENTIN) 875-125 MG per tablet 1 tablet  Status:  Discontinued     1 tablet Oral Every 12 hours 07/11/14 1430 07/12/14 1013   07/06/14 2200  piperacillin-tazobactam (ZOSYN) IVPB 3.375 g  Status:   Discontinued     3.375 g 12.5 mL/hr over 240 Minutes Intravenous Every  8 hours 07/06/14 2121 07/11/14 1430        Assessment/Plan Diverticulitis with microperforation S/p IR drain placement 10/12 -The patient is questioning the antibiotic choice.  On zosyn which has adequate coverage.  Will ask Dr. Dalbert Batman if we should change to [facility], although, I would recommend awaiting cultures. -she is questioning why she is not on an anti-inflammatory.  I explained to her the pathophysiology of diverticulitis and that toradol/NSAIDs are NOT indicated for the treatment of diverticulitis.  Antibiotics and the drain will help.  I will add toradol PRN for pain control. -she has been hospitalized and essentially NPO for 8 days now.  ? When should we consider starting TNA -repeat labs in AM -she is NPO, should we consider starting clears? -SCD/lovenox    Erby Pian, [facility] Surgery Pager [phone](7A-4:30P) For consults and floor pages call [phone](7A-4:30P)  07/14/2014 11:46 AM

## 2014-07-14 NOTE — Sedation Documentation (Signed)
Pt grimacing and lifted head w/ manipulation of needle by Dr. Laurence Ferrari. He ordered more sedation.

## 2014-07-14 NOTE — Sedation Documentation (Signed)
Pt noted w/ less pain now.

## 2014-07-14 NOTE — Procedures (Signed)
Interventional Radiology Procedure Note  Procedure: Successful placement of 36F drain into LLQ diverticular abscess via LEFT transgluteal approach.  10 mL purulent fluid aspirated and sent for Cx.  Cavity then lavaged with sterile saline.  Complications: None immediate Recommendations: - Bag drainage - Monitor output - Cx pending - Flush TID  Signed,  Criselda Peaches, MD

## 2014-07-15 LAB — CBC
HCT: 34.5 % — ABNORMAL LOW (ref 36.0–46.0)
Hemoglobin: 12.2 g/dL (ref 12.0–15.0)
MCH: 30 pg (ref 26.0–34.0)
MCHC: 35.4 g/dL (ref 30.0–36.0)
MCV: 85 fL (ref 78.0–100.0)
Platelets: 453 10*3/uL — ABNORMAL HIGH (ref 150–400)
RBC: 4.06 MIL/uL (ref 3.87–5.11)
RDW: 13.3 % (ref 11.5–15.5)
WBC: 7.8 10*3/uL (ref 4.0–10.5)

## 2014-07-15 LAB — BASIC METABOLIC PANEL
Anion gap: 14 (ref 5–15)
BUN: 5 mg/dL — ABNORMAL LOW (ref 6–23)
CALCIUM: 8.8 mg/dL (ref 8.4–10.5)
CO2: 22 mEq/L (ref 19–32)
Chloride: 101 mEq/L (ref 96–112)
Creatinine, Ser: 0.78 mg/dL (ref 0.50–1.10)
GFR calc non Af Amer: 88 mL/min — ABNORMAL LOW (ref >90)
Glucose, Bld: 119 mg/dL — ABNORMAL HIGH (ref 70–99)
POTASSIUM: 3.8 meq/L (ref 3.7–5.3)
SODIUM: 137 meq/L (ref 137–147)

## 2014-07-15 NOTE — Progress Notes (Signed)
Patient ID: Katherine Hopkins, female   DOB: Feb 22, 1951, 63 y.o.   MRN: [id number]   Referring Physician(s): CCS  Subjective:  Diverticular abscess drain placed 10/12 Feels better overnight/ less pain   Allergies: Betadine and Codeine  Medications: Prior to Admission medications   Medication Sig Start Date End Date Taking? Authorizing Provider  calcium carbonate (OS-CAL) 600 MG TABS tablet Take 600 mg by mouth 2 (two) times daily with a meal.   Yes Historical Provider, MD  cholecalciferol (VITAMIN D) 1000 UNITS tablet Take 1,000 Units by mouth daily.   Yes Historical Provider, MD  glucosamine-chondroitin 500-400 MG tablet Take 1 tablet by mouth 3 (three) times daily.   Yes Historical Provider, MD  Omega-3 Fatty Acids (FISH OIL) 1000 MG CAPS Take 1 capsule by mouth daily.    Yes Historical Provider, MD    Review of Systems  Vital Signs: BP 135/79  Pulse 62  Temp(Src) 98.1 F (36.7 C) (Oral)  Resp 16  Ht 5' 6.5" (1.689 m)  Wt 69.5 kg (153 lb 3.5 oz)  BMI 24.36 kg/m2  SpO2 97%  Physical Exam  Abdominal:  Site clean and dry NT No bleeding Output bloody/brown 40 cc  Cx pending afeb    Imaging: Ct Abdomen Pelvis W Contrast  07/12/2014   CLINICAL DATA:  Diverticulitis, on antibiotic treatment for 1 week, increased in left lower quadrant pain  EXAM: CT ABDOMEN AND PELVIS WITH CONTRAST  TECHNIQUE: Multidetector CT imaging of the abdomen and pelvis was performed using the standard protocol following bolus administration of intravenous contrast.  CONTRAST:  176mL OMNIPAQUE IOHEXOL 300 MG/ML  SOLN  COMPARISON:  07/06/2014  FINDINGS: Sagittal images of the spine shows mild degenerative changes lumbar spine. Lung bases are unremarkable.  Liver, pancreas, spleen and adrenals are unremarkable. Abdominal aorta is unremarkable. No calcified gallstones are noted within gallbladder.  No small bowel obstruction.  No adenopathy. No pericecal inflammation. The terminal ileum is unremarkable.   There is a low lying cecum.  Again noted colonic diverticula in proximal sigmoid colon. There is worsening colitis or diverticulitis proximal sigmoid colon in left lower quadrant as seen in axial image 56. There is focal thickening of colonic wall and stranding of surrounding fat. Again noted extraluminal air and there is a pericolonic abscess with a fluid component and gas component. Measures 3.6 x 3.5 cm. The gas component best seen in coronal image 42 measures about 3 cm. Additional tiny foci of extraluminal air noted more proximally in sigmoid colon see coronal image 26.  Retroflexed uterus. No adnexal mass. The urinary bladder is unremarkable. No inguinal adenopathy.  IMPRESSION: 1. There is worsening diverticulitis in proximal sigmoid colon. Axial image 58 there is thickening of colonic wall in proximal sigmoid colon. Two tiny new foci of extraluminal air are suspected. Again noted extraluminal air and pericolonic abscess is noted now with a fluid component and gas component. The fluid component measures 3.6 x 3.5 cm. The gas component measures about 3 cm best seen in coronal image 42. 2. No hydronephrosis or hydroureter. 3. There is a low lying cecum without pericecal inflammation. 4. Retroflexed uterus.   Electronically Signed   By: Lahoma Crocker M.D.   On: 07/12/2014 15:13   Ct Image Guided Drainage By Percutaneous Catheter  07/14/2014   CLINICAL DATA:  63 year old female with sigmoid diverticulitis and micro perforation resulting in left pelvic sidewall abscess. One week trial of intravenous antibiotics has failed to demonstrate significant clinical improvement. Attempted CT-guided drain placement  to be performed today. The window is limited.  EXAM: CT IMAGE GUIDED DRAINAGE BY PERCUTANEOUS CATHETER  Date: 07/14/2014  PROCEDURE: 1. CT-guided drain placement Interventional Radiologist:  Criselda Peaches, MD  ANESTHESIA/SEDATION: Moderate (conscious) sedation was used. 2.5 mg Versed, 100 mcg Fentanyl  were administered intravenously. The patient's vital signs were monitored continuously by radiology nursing throughout the procedure.  Sedation Time: 40 minutes  TECHNIQUE: Informed consent was obtained from the patient following explanation of the procedure, risks, benefits and alternatives. The patient understands, agrees and consents for the procedure. All questions were addressed. A time out was performed.  A planning axial CT scan was performed. The left pelvic sidewall abscess collection was identified. A suitable skin entry site was selected and marked. Local anesthesia was attained by infiltration with 1% lidocaine. Under CT fluoroscopic guidance, an 18 gauge trocar needle was carefully advanced along a left trans gluteal approach. With the assistance of a curves 22 gauge Chiba needle, the 18 gauge trocar was successfully advanced into the complex fluid collection. A 0.035 inch wire was then coiled within the fluid and gas collection. The tract was serially dilated to 10 [place] and a [place] all-purpose drainage catheter was advanced over the wire and formed within the collection.  Approximately 10 mL of thick, mildly bloody and purulent fluid was aspirated. Samples were sent for Gram stain and culture. The tube was then gently flushing connected to gravity bag drainage. The tube was secured to the skin with 0 Prolene suture and an adhesive fixation device.  The patient tolerated the procedure well.  COMPLICATIONS: None immediate  IMPRESSION: Successful placement of a 10 French drainage catheter into the left pelvic sidewall abscess. Aspiration yielded 10 mL of thick, mildly bloody purulent fluid. Samples were sent for Gram stain and culture.  Signed,  Criselda Peaches, MD  [facility]  [facility]   Electronically Signed   By: Jacqulynn Cadet M.D.   On: 07/14/2014 14:34    Labs:  CBC:  Recent Labs  07/09/14 0637 07/11/14 1020  07/13/14 0450 07/14/14 2325  WBC 7.5 6.9 14.4* 7.8  HGB 12.2 13.9 13.9 12.2  HCT 35.8* 39.5 39.3 34.5*  PLT 194 254 347 453*    COAGS:  Recent Labs  07/14/14 0549  INR 1.07    BMP:  Recent Labs  07/10/14 0450 07/11/14 0441 07/13/14 0450 07/14/14 2325  NA 139 141 135* 137  K 2.9* 4.3 4.0 3.8  CL 98 101 96 101  CO2 27 24 22 22   GLUCOSE 127* 104* 131* 119*  BUN 4* 5* 7 5*  CALCIUM 9.1 9.4 9.5 8.8  CREATININE 0.80 0.82 0.97 0.78  GFRNONAA 78* 76* 62* 88*  GFRAA >90 88* 72* >90    LIVER FUNCTION TESTS:  Recent Labs  06/04/14 0845 07/06/14 1435 07/13/14 0450  BILITOT 0.8 0.5 0.8  AST 23 28 17   ALT 16 21 17   ALKPHOS 59 63 78  PROT 7.2 7.0 7.5  ALBUMIN 4.7 4.1 3.3*    Assessment and Plan:  Pelvic/divertic abscess drain placed 10/12 Doing well Feeling less painful  Will follow Plan per CCS   I spent a total of 15 minutes face to face in clinical consultation/evaluation, greater than 50% of which was counseling/coordinating care for divertic abscess drain  Signed: Joseth Weigel A 07/15/2014, 7:32 AM

## 2014-07-15 NOTE — Progress Notes (Signed)
I agree that she seems better today. She is feeling better. She seems more comfortable with the treatment plan and her progress. Abdominal exam reveals a very localized tenderness but no mass left lower quadrant. Rest of her exam is unremarkable.  Continue Invanz Full liquid diet only Followup CT scan when drainage has subsided and turns serosanguineous.   Edsel Petrin. Dalbert Batman, M.D., [facility] General and Minimally invasive Surgery Breast and Colorectal Surgery

## 2014-07-15 NOTE — Progress Notes (Signed)
Patient ID: Katherine Hopkins, female   DOB: 1951/08/10, 63 y.o.   MRN: [id number]     [address] [place], [place] [address]    Phone: [phone] FAX: [phone]     Subjective: Hungry.  Lots of "gurgling"  Less pain.  No n/v.  VSS.  Afebrile.  WBC normal.  Had some vaginal bleeding since IR procedure, minimal.    Objective:  Vital signs:  Filed Vitals:   07/14/14 1426 07/14/14 2129 07/15/14 0155 07/15/14 0535  BP: 157/80 135/75 140/76 135/79  Pulse: 63 65 57 62  Temp: 97.9 F (36.6 C) 98.3 F (36.8 C) 97.6 F (36.4 C) 98.1 F (36.7 C)  TempSrc: Oral Oral Oral Oral  Resp: _0 Height:      Weight:      SpO2: 98% 100% 98% 97%    Last BM Date: 07/14/14  Intake/Output   Yesterday:  10/12 0701 - 10/13 0700 In: 3785 [P.O.:360; I.V.:2750; IV Piggyback:157] Out: 40 [Drains:40] This shift:    I/O last 3 completed shifts: In: 8850 [P.O.:360; I.V.:4350; Other:17; IV Piggyback:157] Out: 40 [Drains:40]    Physical Exam: General: Pt awake/alert/oriented x4 in no acute distress Chest: cta. No chest wall pain w good excursion CV:  Pulses intact.  Regular rhythm MS: Normal AROM mjr joints.  No obvious deformity Abdomen: Soft.  Nondistended. Mild ttp LLQ. No evidence of peritonitis.  No incarcerated hernias. Ext:  SCDs BLE.  No mjr edema.  No cyanosis Skin: No petechiae / purpura   Problem List:   Active Problems:   Diverticulitis of colon    Results:   Labs: Results for orders placed during the hospital encounter of 07/06/14 (from the past 48 hour(s))  PROTIME-INR     Status: None   Collection Time    07/14/14  5:49 AM      Result Value Ref Range   Prothrombin Time 13.9  11.6 - 15.2 seconds   INR 1.07  0.00 - 1.49  CBC     Status: Abnormal   Collection Time    07/14/14 11:25 PM      Result Value Ref Range   WBC 7.8  4.0 - 10.5 K/uL   RBC 4.06  3.87 - 5.11 MIL/uL   Hemoglobin 12.2   12.0 - 15.0 g/dL   HCT 34.5 (*) 36.0 - 46.0 %   MCV 85.0  78.0 - 100.0 fL   MCH 30.0  26.0 - 34.0 pg   MCHC 35.4  30.0 - 36.0 g/dL   RDW 13.3  11.5 - 15.5 %   Platelets 453 (*) 150 - 400 K/uL   Comment: DELTA CHECK NOTED  BASIC METABOLIC PANEL     Status: Abnormal   Collection Time    07/14/14 11:25 PM      Result Value Ref Range   Sodium 137  137 - 147 mEq/L   Potassium 3.8  3.7 - 5.3 mEq/L   Chloride 101  96 - 112 mEq/L   CO2 22  19 - 32 mEq/L   Glucose, Bld 119 (*) 70 - 99 mg/dL   BUN 5 (*) 6 - 23 mg/dL   Creatinine, Ser 0.78  0.50 - 1.10 mg/dL   Calcium 8.8  8.4 - 10.5 mg/dL   GFR calc non Af Amer 88 (*) >90 mL/min   GFR calc Af Amer >90  >90 mL/min   Comment: (NOTE)  The eGFR has been calculated using the CKD EPI equation.     This calculation has not been validated in all clinical situations.     eGFR's persistently <90 mL/min signify possible Chronic Kidney     Disease.   Anion gap 14  5 - 15    Imaging / Studies: Ct Image Guided Drainage By Percutaneous Catheter  07/14/2014   CLINICAL DATA:  63 year old female with sigmoid diverticulitis and micro perforation resulting in left pelvic sidewall abscess. One week trial of intravenous antibiotics has failed to demonstrate significant clinical improvement. Attempted CT-guided drain placement to be performed today. The window is limited.  EXAM: CT IMAGE GUIDED DRAINAGE BY PERCUTANEOUS CATHETER  Date: 07/14/2014  PROCEDURE: 1. CT-guided drain placement Interventional Radiologist:  Criselda Peaches, MD  ANESTHESIA/SEDATION: Moderate (conscious) sedation was used. 2.5 mg Versed, 100 mcg Fentanyl were administered intravenously. The patient's vital signs were monitored continuously by radiology nursing throughout the procedure.  Sedation Time: 40 minutes  TECHNIQUE: Informed consent was obtained from the patient following explanation of the procedure, risks, benefits and alternatives. The patient understands, agrees and consents  for the procedure. All questions were addressed. A time out was performed.  A planning axial CT scan was performed. The left pelvic sidewall abscess collection was identified. A suitable skin entry site was selected and marked. Local anesthesia was attained by infiltration with 1% lidocaine. Under CT fluoroscopic guidance, an 18 gauge trocar needle was carefully advanced along a left trans gluteal approach. With the assistance of a curves 22 gauge Chiba needle, the 18 gauge trocar was successfully advanced into the complex fluid collection. A 0.035 inch wire was then coiled within the fluid and gas collection. The tract was serially dilated to 10 [place] and a [place] all-purpose drainage catheter was advanced over the wire and formed within the collection.  Approximately 10 mL of thick, mildly bloody and purulent fluid was aspirated. Samples were sent for Gram stain and culture. The tube was then gently flushing connected to gravity bag drainage. The tube was secured to the skin with 0 Prolene suture and an adhesive fixation device.  The patient tolerated the procedure well.  COMPLICATIONS: None immediate  IMPRESSION: Successful placement of a 10 French drainage catheter into the left pelvic sidewall abscess. Aspiration yielded 10 mL of thick, mildly bloody purulent fluid. Samples were sent for Gram stain and culture.  Signed,  Criselda Peaches, MD  [facility]  [facility]   Electronically Signed   By: Jacqulynn Cadet M.D.   On: 07/14/2014 14:34    Medications / Allergies:  Scheduled Meds: . enoxaparin (LOVENOX) injection  40 mg Subcutaneous Q24H  . ertapenem  1 g Intravenous Q24H  . pantoprazole  40 mg Oral QHS   Continuous Infusions: . dextrose 5 % and 0.45% NaCl 1,000 mL with potassium chloride 20 mEq infusion 100 mL/hr at 07/15/14 0220   PRN Meds:.acetaminophen, acetaminophen, fentaNYL, iohexol, ketorolac,  ondansetron  Antibiotics: Anti-infectives   Start     Dose/Rate Route Frequency Ordered Stop   07/14/14 1430  ertapenem (INVANZ) 1 g in sodium chloride 0.9 % 50 mL IVPB     1 g 100 mL/hr over 30 Minutes Intravenous Every 24 hours 07/14/14 1254     07/12/14 1400  piperacillin-tazobactam (ZOSYN) IVPB 3.375 g  Status:  Discontinued     3.375 g 12.5 mL/hr over 240 Minutes Intravenous 3 times per day 07/12/14 1013 07/14/14 1254   07/11/14 1530  amoxicillin-clavulanate (AUGMENTIN) 875-125 MG per tablet 1 tablet  Status:  Discontinued     1 tablet Oral Every 12 hours 07/11/14 1430 07/12/14 1013   07/06/14 2200  piperacillin-tazobactam (ZOSYN) IVPB 3.375 g  Status:  Discontinued     3.375 g 12.5 mL/hr over 240 Minutes Intravenous Every 8 hours 07/06/14 2121 07/11/14 1430        Assessment/Plan Diverticulitis with microperforation  S/p IR drain placement 10/12  -Invanz started 10/12 -clears started 10/12-->advance to fulls -follow cultures -toradol PRN for pain -SCD/lovenox -SCD/lovenox -reports small amount of vaginal bleeding, no stool.  Will watch for now.  Will consider GYN if symptoms persist.   Erby Pian, ANP-BC [facility] Surgery Pager [phone](7A-4:30P) For consults and floor pages call [phone](7A-4:30P)  07/15/2014 8:25 AM

## 2014-07-16 ENCOUNTER — Inpatient Hospital Stay (HOSPITAL_COMMUNITY): Payer: BC Managed Care – PPO

## 2014-07-16 MED ORDER — IOHEXOL 300 MG/ML  SOLN
50.0000 mL | Freq: Once | INTRAMUSCULAR | Status: AC | PRN
  Administered 2014-07-16: 10 mL via INTRAVENOUS

## 2014-07-16 MED ORDER — SODIUM CHLORIDE 0.9 % IJ SOLN
10.0000 mL | INTRAMUSCULAR | Status: DC | PRN
  Administered 2014-07-17: 10 mL

## 2014-07-16 NOTE — Progress Notes (Signed)
Peripherally Inserted Central Catheter/Midline Placement  The IV Nurse has discussed with the patient and/or persons authorized to consent for the patient, the purpose of this procedure and the potential benefits and risks involved with this procedure.  The benefits include less needle sticks, lab draws from the catheter and patient may be discharged home with the catheter.  Risks include, but not limited to, infection, bleeding, blood clot (thrombus formation), and puncture of an artery; nerve damage and irregular heat beat.  Alternatives to this procedure were also discussed.  PICC/Midline Placement Documentation        Katherine Hopkins 07/16/2014, 5:05 PM

## 2014-07-16 NOTE — Progress Notes (Signed)
Seen and agree  

## 2014-07-16 NOTE — Progress Notes (Signed)
Patient ID: Katherine Hopkins, female   DOB: 1951-08-22, 63 y.o.   MRN: [id number]  I have called R. Buccini who will see the patient later today or this evening.  I have called Pam with IR to inject the drain.  We will likely repeat CT scan on Friday or Saturday.  I once again reiterated to the patient that if she wishes to have another opinion and does not have trust in [place] as caregivers that we would be delighted to facilitate transfer to [facility].  She declined this at present time.  She has not IV access and IVT unable to place a peripheral.  We will place a PICC line as she will be on IV antibiotics for several days.  Robben Jagiello, ANP-BC

## 2014-07-16 NOTE — Progress Notes (Signed)
Noted and agree.  Adin Hector

## 2014-07-16 NOTE — Progress Notes (Signed)
Dear Doctor: This patient has been identified as a candidate for PICC for the following reason (s): drug pH or osmolality (causing phlebitis, infiltration in 24 hours) If you agree, please write an order for the indicated device. For any questions contact the Vascular Access Team at [phone] if no answer, please leave a message. Pt has had multiple PIV starts since admission.   Thank you for supporting the early vascular access assessment program. Catalina Pizza

## 2014-07-16 NOTE — Consult Note (Signed)
Referring Provider: Dr. Fanny Skates Primary Care Physician:  No PCP Per Patient Primary Gastroenterologist:  Dr. Earlean Shawl  Reason for Consultation:  Diverticulitis, "specialist" opinion  HPI: Katherine Hopkins is a 63 y.o. female admitted to the hospital 10 days ago with her first ever attack of diverticulitis. Interestingly, her mother in at least 2 sisters have also had what sounds like complicated diverticulitis.  The patient presented with severe lower, pain but normal white count and temperature. A CT scan showed extensive diverticulosis in the sigmoid region with associated inflammation and contained perforation. The patient was managed on antibiotic therapy with an initial good response over the first several days, but then, with dietary advancement, had an increase in symptoms and was found to have an abscess which was drained percutaneously 4 days ago. Since then, she has continued to have pain but is making some degree of progress. She is still in the liquid diet and a PICC line for nutrition support has been inserted.  The patient has requested input from a gastroenterologist concerning her case and her condition.   Past Medical History  Diagnosis Date  . Bulging disc     Past Surgical History  Procedure Laterality Date  . Appendectomy    . Knee surgery    . Foot surgery    . Elbow surgery      Prior to Admission medications   Medication Sig Start Date End Date Taking? Authorizing Provider  calcium carbonate (OS-CAL) 600 MG TABS tablet Take 600 mg by mouth 2 (two) times daily with a meal.   Yes Historical Provider, MD  cholecalciferol (VITAMIN D) 1000 UNITS tablet Take 1,000 Units by mouth daily.   Yes Historical Provider, MD  glucosamine-chondroitin 500-400 MG tablet Take 1 tablet by mouth 3 (three) times daily.   Yes Historical Provider, MD  Omega-3 Fatty Acids (FISH OIL) 1000 MG CAPS Take 1 capsule by mouth daily.    Yes Historical Provider, MD    Current  Facility-Administered Medications  Medication Dose Route Frequency Provider Last Rate Last Dose  . acetaminophen (TYLENOL) tablet 650 mg  650 mg Oral Q6H PRN Rolm Bookbinder, MD   650 mg at 07/16/14 5631   Or  . acetaminophen (TYLENOL) suppository 650 mg  650 mg Rectal Q6H PRN Rolm Bookbinder, MD      . dextrose 5 % and 0.45% NaCl 1,000 mL with potassium chloride 20 mEq infusion   Intravenous Continuous Donnie Mesa, MD 100 mL/hr at 07/15/14 0220    . enoxaparin (LOVENOX) injection 40 mg  40 mg Subcutaneous Q24H Rolm Bookbinder, MD   40 mg at 07/15/14 2200  . ertapenem (INVANZ) 1 g in sodium chloride 0.9 % 50 mL IVPB  1 g Intravenous Q24H Fanny Skates, MD   1 g at 07/16/14 1755  . fentaNYL (SUBLIMAZE) injection 12.5-50 mcg  12.5-50 mcg Intravenous Q2H PRN Nehemiah Massed Dort, PA-C   25 mcg at 07/14/14 0957  . iohexol (OMNIPAQUE) 300 MG/ML solution 25 mL  25 mL Oral PRN Medication Radiologist, MD   25 mL at 07/06/14 1500  . ketorolac (TORADOL) 15 MG/ML injection 15 mg  15 mg Intravenous Q8H PRN Emina Riebock, NP   15 mg at 07/15/14 1357  . ondansetron (ZOFRAN) injection 4 mg  4 mg Intravenous Q6H PRN Rolm Bookbinder, MD   4 mg at 07/08/14 1045  . pantoprazole (PROTONIX) EC tablet 40 mg  40 mg Oral QHS Darnell Level Mancheril, RPH   40 mg at 07/15/14 2200  .  sodium chloride 0.9 % injection 10-40 mL  10-40 mL Intracatheter PRN Cleotis Nipper, MD        Allergies as of 07/06/2014 - Review Complete 07/06/2014  Allergen Reaction Noted  . Betadine [povidone iodine] Itching 05/30/2013  . Codeine Itching 05/30/2013    Family History  Problem Relation Age of Onset  . Breast cancer Mother 47  . Hypertension Mother   . Hyperlipidemia Father   . Stroke Father   . Cancer Brother     LUNG    History   Social History  . Marital Status: Married    Spouse Name: N/A    Number of Children: N/A  . Years of Education: N/A   Occupational History  . Not on file.   Social History Main Topics  .  Smoking status: Never Smoker   . Smokeless tobacco: Not on file  . Alcohol Use: No  . Drug Use: Not on file  . Sexual Activity: Not on file   Other Topics Concern  . Not on file   Social History Narrative  . No narrative on file    Review of Systems: Not obtained  Physical Exam: Vital signs in last 24 hours: Temp:  [97.9 F (36.6 C)-98.3 F (36.8 C)] 97.9 F (36.6 C) (10/14 1300) Pulse Rate:  [61-66] 66 (10/14 1300) Resp:  [16-18] 18 (10/14 1300) BP: (119-132)/(73-88) 121/88 mmHg (10/14 1300) SpO2:  [94 %-99 %] 94 % (10/14 1300) Last BM Date: 07/16/14  This is an articulate, healthy-appearing Caucasian female in no distress, able to sit up on the side of the bed without difficulty. She is percutaneous drain in place. At this time, there is essentially no abdominal tenderness..  Intake/Output from previous day: 10/13 0701 - 10/14 0700 In: 15 [IV Piggyback:50] Out: 24 [Urine:4; Drains:20] Intake/Output this shift:    Lab Results:  Recent Labs  07/14/14 2325  WBC 7.8  HGB 12.2  HCT 34.5*  PLT 453*   BMET  Recent Labs  07/14/14 2325  NA 137  K 3.8  CL 101  CO2 22  GLUCOSE 119*  BUN 5*  CREATININE 0.78  CALCIUM 8.8   LFT No results found for this basename: PROT, ALBUMIN, AST, ALT, ALKPHOS, BILITOT, BILIDIR, IBILI,  in the last 72 hours PT/INR  Recent Labs  07/14/14 0549  LABPROT 13.9  INR 1.07    Studies/Results: Ir Sinus/fist Tube Chk-non Gi  07/16/2014   CLINICAL DATA:  63 year old with a diverticular abscess. Evaluate for a fistula connection.  EXAM: SINUS TRACT INJECTION/FISTULOGRAM  Physician: Stephan Minister. Anselm Pancoast, MD  FLUOROSCOPY TIME:  18 seconds  MEDICATIONS: 10 mL Omnipaque 300  ANESTHESIA/SEDATION: Moderate sedation time: None  PROCEDURE: Patient was placed prone on the fluoroscopic table. Contrast was injected through the drainage catheter. The contrast was aspirated at the end of the procedure and the drain was flushed with sterile saline.   FINDINGS: Contrast fills a small abscess cavity and there is a connection to the adjacent colon. 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  COMPLICATIONS: None  IMPRESSION: Positive for a fistula connection between the abscess cavity and the colon.   Electronically Signed   By: Markus Daft M.D.   On: 07/16/2014 17:13    Impression: Diverticulitis with complication of abscess formation, responding to percutaneous drainage and antibiotic therapy  Plan: 1. Agree with current management 2. As per patient request, the purpose of this patient-initiated consultation was not so much to assist in the management of the patient,  but rather, to help educate the patient concerning her condition. Accordingly, an hour and 10 minutes was spent with the patient, initially reviewing her history of events while in the hospital, but then spending the vast majority of the time discussing with the patient the following topics: Pathophysiology of diverticulitis, epidemiology of diverticulosis and diverticulitis, the absence of extensive data concerning optimal management including choice of antibiotics, the timing of surgery, and the role of dietary factors, possible complications of diverticulitis, and circumstances in which surgery is needed and factors which weigh into that decision, as well as the difference between resection with diverting colostomy and primary anastomosis and the circumstances in which each of those is typically performed. All questions were answered to the patient's satisfaction. At this time, her main concern seemed to be whether, in the event of the need for urgent surgery during this hospitalization, the surgeon would be willing to remain open to the possibility of a primary anastomosis being performed if conditions permitted it, although as I explained, the likelihood of being able to do a primary anastomosis in an emergent setting with an unprepped bowel would be very low. 3. The patient points out  she has had some vaginal spotting while in the hospital. I explained that this is probably a consequence of sympathetic inflammation of the uterine lining from adjacent diverticular inflammation, but following discharge, followup with her gynecologist to address this would be very important, so as to exclude endometrial cancer. 4. Please let me know if I can be of further assistance with this patient, but as of the present time, I will plan to see her primarily upon further request from you.  LOS: 10 days   Marguita Venning V  07/16/2014, 7:22 PM

## 2014-07-16 NOTE — Progress Notes (Signed)
Patient ID: Katherine Hopkins, female   DOB: [birth year], 63 y.o.   MRN: [id number]     [address] [place], [place] [address]    Phone: [phone] FAX: [phone]     Subjective: Less tender today.  Afebrile.  VSS.    Objective:  Vital signs:  Filed Vitals:   07/15/14 0535 07/15/14 1403 07/15/14 2221 07/16/14 0558  BP: 135/79 123/85 132/74 119/73  Pulse: 62 72 66 61  Temp: 98.1 F (36.7 C) 98.4 F (36.9 C) 98.3 F (36.8 C) 98.1 F (36.7 C)  TempSrc: Oral Oral Oral   Resp: _0 Height:      Weight:      SpO2: 97% 99% 98% 99%    Last BM Date: 07/14/14  Intake/Output   Yesterday:  10/13 0701 - 10/14 0700 In: 60 [IV Piggyback:50] Out: 24 [Urine:4; Drains:20] This shift:    I/O last 3 completed shifts: In: 1267 [I.V.:1200; Other:17; IV Piggyback:50] Out: 68 [Urine:4; Drains:60]    Physical Exam:  General: Pt awake/alert/oriented x4 in no acute distress  Chest: cta. No chest wall pain w good excursion  CV: Pulses intact. Regular rhythm  MS: Normal AROM mjr joints. No obvious deformity  Abdomen: Soft. Nondistended. Mild ttp LLQ. No evidence of peritonitis. No incarcerated hernias.  Ext: SCDs BLE. No mjr edema. No cyanosis  Skin: No petechiae / purpura    Problem List:   Active Problems:   Diverticulitis of colon    Results:   Labs: Results for orders placed during the hospital encounter of 07/06/14 (from the past 48 hour(s))  CULTURE, ROUTINE-ABSCESS     Status: None   Collection Time    07/14/14 10:22 AM      Result Value Ref Range   Specimen Description ABSCESS PERITONEAL CAVITY     Special Requests NONE     Gram Stain       Value: MODERATE WBC PRESENT, PREDOMINANTLY PMN     NO SQUAMOUS EPITHELIAL CELLS SEEN     NO ORGANISMS SEEN     Performed at [facility]   Culture       Value: NO GROWTH 1 DAY     Performed at [facility]   Report Status PENDING     ANAEROBIC CULTURE     Status: None   Collection Time    07/14/14 10:22 AM      Result Value Ref Range   Specimen Description ABSCESS PERITONEAL CAVITY     Special Requests Normal     Gram Stain       Value: ABUNDANT WBC PRESENT,BOTH PMN AND MONONUCLEAR     NO SQUAMOUS EPITHELIAL CELLS SEEN     NO ORGANISMS SEEN     Performed at [facility]   Culture       Value: NO ANAEROBES ISOLATED; CULTURE IN PROGRESS FOR 5 DAYS     Performed at [facility]   Report Status PENDING    CBC     Status: Abnormal   Collection Time    07/14/14 11:25 PM      Result Value Ref Range   WBC 7.8  4.0 - 10.5 K/uL   RBC 4.06  3.87 - 5.11 MIL/uL   Hemoglobin 12.2  12.0 - 15.0 g/dL   HCT 34.5 (*) 36.0 - 46.0 %   MCV 85.0  78.0 - 100.0 fL  MCH 30.0  26.0 - 34.0 pg   MCHC 35.4  30.0 - 36.0 g/dL   RDW 13.3  11.5 - 15.5 %   Platelets 453 (*) 150 - 400 K/uL   Comment: DELTA CHECK NOTED  BASIC METABOLIC PANEL     Status: Abnormal   Collection Time    07/14/14 11:25 PM      Result Value Ref Range   Sodium 137  137 - 147 mEq/L   Potassium 3.8  3.7 - 5.3 mEq/L   Chloride 101  96 - 112 mEq/L   CO2 22  19 - 32 mEq/L   Glucose, Bld 119 (*) 70 - 99 mg/dL   BUN 5 (*) 6 - 23 mg/dL   Creatinine, Ser 0.78  0.50 - 1.10 mg/dL   Calcium 8.8  8.4 - 10.5 mg/dL   GFR calc non Af Amer 88 (*) >90 mL/min   GFR calc Af Amer >90  >90 mL/min   Comment: (NOTE)     The eGFR has been calculated using the CKD EPI equation.     This calculation has not been validated in all clinical situations.     eGFR's persistently <90 mL/min signify possible Chronic Kidney     Disease.   Anion gap 14  5 - 15    Imaging / Studies: Ct Image Guided Drainage By Percutaneous Catheter  07/14/2014   CLINICAL DATA:  63 year old female with sigmoid diverticulitis and micro perforation resulting in left pelvic sidewall abscess. One week trial of intravenous antibiotics has failed to demonstrate significant clinical improvement.  Attempted CT-guided drain placement to be performed today. The window is limited.  EXAM: CT IMAGE GUIDED DRAINAGE BY PERCUTANEOUS CATHETER  Date: 07/14/2014  PROCEDURE: 1. CT-guided drain placement Interventional Radiologist:  Criselda Peaches, MD  ANESTHESIA/SEDATION: Moderate (conscious) sedation was used. 2.5 mg Versed, 100 mcg Fentanyl were administered intravenously. The patient's vital signs were monitored continuously by radiology nursing throughout the procedure.  Sedation Time: 40 minutes  TECHNIQUE: Informed consent was obtained from the patient following explanation of the procedure, risks, benefits and alternatives. The patient understands, agrees and consents for the procedure. All questions were addressed. A time out was performed.  A planning axial CT scan was performed. The left pelvic sidewall abscess collection was identified. A suitable skin entry site was selected and marked. Local anesthesia was attained by infiltration with 1% lidocaine. Under CT fluoroscopic guidance, an 18 gauge trocar needle was carefully advanced along a left trans gluteal approach. With the assistance of a curves 22 gauge Chiba needle, the 18 gauge trocar was successfully advanced into the complex fluid collection. A 0.035 inch wire was then coiled within the fluid and gas collection. The tract was serially dilated to 10 [place] and a [place] all-purpose drainage catheter was advanced over the wire and formed within the collection.  Approximately 10 mL of thick, mildly bloody and purulent fluid was aspirated. Samples were sent for Gram stain and culture. The tube was then gently flushing connected to gravity bag drainage. The tube was secured to the skin with 0 Prolene suture and an adhesive fixation device.  The patient tolerated the procedure well.  COMPLICATIONS: None immediate  IMPRESSION: Successful placement of a 10 French drainage catheter into the left pelvic sidewall abscess. Aspiration yielded 10 mL of  thick, mildly bloody purulent fluid. Samples were sent for Gram stain and culture.  Signed,  Criselda Peaches, MD  [facility]  [facility]  Electronically Signed   By: Jacqulynn Cadet M.D.   On: 07/14/2014 14:34    Medications / Allergies:  Scheduled Meds: . enoxaparin (LOVENOX) injection  40 mg Subcutaneous Q24H  . ertapenem  1 g Intravenous Q24H  . pantoprazole  40 mg Oral QHS   Continuous Infusions: . dextrose 5 % and 0.45% NaCl 1,000 mL with potassium chloride 20 mEq infusion 100 mL/hr at 07/15/14 0220   PRN Meds:.acetaminophen, acetaminophen, fentaNYL, iohexol, ketorolac, ondansetron  Antibiotics: Anti-infectives   Start     Dose/Rate Route Frequency Ordered Stop   07/14/14 1430  ertapenem (INVANZ) 1 g in sodium chloride 0.9 % 50 mL IVPB     1 g 100 mL/hr over 30 Minutes Intravenous Every 24 hours 07/14/14 1254     07/12/14 1400  piperacillin-tazobactam (ZOSYN) IVPB 3.375 g  Status:  Discontinued     3.375 g 12.5 mL/hr over 240 Minutes Intravenous 3 times per day 07/12/14 1013 07/14/14 1254   07/11/14 1530  amoxicillin-clavulanate (AUGMENTIN) 875-125 MG per tablet 1 tablet  Status:  Discontinued     1 tablet Oral Every 12 hours 07/11/14 1430 07/12/14 1013   07/06/14 2200  piperacillin-tazobactam (ZOSYN) IVPB 3.375 g  Status:  Discontinued     3.375 g 12.5 mL/hr over 240 Minutes Intravenous Every 8 hours 07/06/14 2121 07/11/14 1430       Assessment/Plan  Diverticulitis with microperforation  S/p IR drain placement 10/12  -Invanz started 10/12  -full started 10/13-->continue today -follow cultures, negative to date -toradol PRN for pain  -reports small amount of vaginal bleeding, no stool. Will watch for now. -as requested, will ask IR and Dr. Dalbert Batman if okay to inject drain.  She also requested a GI consultation, not going to be much of a benefit, will ask Dr. Dalbert Batman.  Based on above, she may want to transfer to [facility],  but is "torn." -check CBC in AM  Erby Pian, [facility] Surgery Pager [phone](7A-4:30P) For consults and floor pages call [phone](7A-4:30P)  07/16/2014 8:36 AM

## 2014-07-16 NOTE — Progress Notes (Addendum)
General surgery attending:  Patient interviewed and examined. Agree with assessment above. Clinical course improving. I spent a long time discussing algorithms for care and progress to date.   I probably spent 30-45 minutes discussing her care plan and different pathways that it might go down. She seems to understand all this well. She expressed appreciation for the detailed discussion.  Drain injection today. If drainage x-ray shows no cavity and no communication with colon, then proceed with CT scan tomorrow to rule out undrained abscess.   Edsel Petrin. Dalbert Batman, M.D., [facility] General and Minimally invasive Surgery Breast and Colorectal Surgery Office:   [phone]

## 2014-07-17 ENCOUNTER — Inpatient Hospital Stay (HOSPITAL_COMMUNITY): Payer: BC Managed Care – PPO

## 2014-07-17 LAB — CBC
HCT: 35.3 % — ABNORMAL LOW (ref 36.0–46.0)
Hemoglobin: 12.2 g/dL (ref 12.0–15.0)
MCH: 29.9 pg (ref 26.0–34.0)
MCHC: 34.6 g/dL (ref 30.0–36.0)
MCV: 86.5 fL (ref 78.0–100.0)
Platelets: 457 10*3/uL — ABNORMAL HIGH (ref 150–400)
RBC: 4.08 MIL/uL (ref 3.87–5.11)
RDW: 13.4 % (ref 11.5–15.5)
WBC: 6.7 10*3/uL (ref 4.0–10.5)

## 2014-07-17 LAB — CULTURE, ROUTINE-ABSCESS

## 2014-07-17 MED ORDER — IOHEXOL 300 MG/ML  SOLN
100.0000 mL | Freq: Once | INTRAMUSCULAR | Status: AC | PRN
  Administered 2014-07-17: 100 mL via INTRAVENOUS

## 2014-07-17 MED ORDER — IOHEXOL 300 MG/ML  SOLN
25.0000 mL | INTRAMUSCULAR | Status: AC
  Administered 2014-07-17: 25 mL via ORAL

## 2014-07-17 NOTE — Progress Notes (Signed)
Patient ID: Katherine Hopkins, female   DOB: 03-08-1951, 63 y.o.   MRN: [id number]   Referring Physician(s): CCS  Subjective:  Diverticular abscess drain placed 10/12 Drain inj 10/14 showed + communication to bowel CT today shows no new abscess; much improved LLQ abscess Feels better daily Ambulating well  Allergies: Betadine and Codeine  Medications: Prior to Admission medications   Medication Sig Start Date End Date Taking? Authorizing Provider  calcium carbonate (OS-CAL) 600 MG TABS tablet Take 600 mg by mouth 2 (two) times daily with a meal.   Yes Historical Provider, MD  cholecalciferol (VITAMIN D) 1000 UNITS tablet Take 1,000 Units by mouth daily.   Yes Historical Provider, MD  glucosamine-chondroitin 500-400 MG tablet Take 1 tablet by mouth 3 (three) times daily.   Yes Historical Provider, MD  Omega-3 Fatty Acids (FISH OIL) 1000 MG CAPS Take 1 capsule by mouth daily.    Yes Historical Provider, MD    Review of Systems  Vital Signs: BP 149/84  Pulse 64  Temp(Src) 98 F (36.7 C) (Oral)  Resp 14  Ht 5' 6.5" (1.689 m)  Wt 69.5 kg (153 lb 3.5 oz)  BMI 24.36 kg/m2  SpO2 97%  Physical Exam  Skin:  L TG drain intact Output reddish Rare candida Site clean and dry; sl tender No bleeding Afeb; wbc wnl    Imaging: Ct Abdomen Pelvis W Contrast  07/17/2014   CLINICAL DATA:  Recent diverticulitis with abscess; percutaneously placed drain in place  EXAM: CT ABDOMEN AND PELVIS WITH CONTRAST  TECHNIQUE: Multidetector CT imaging of the abdomen and pelvis was performed using the standard protocol following bolus administration of intravenous contrast. Oral contrast was also administered.  CONTRAST:  174mL OMNIPAQUE IOHEXOL 300 MG/ML  SOLN  COMPARISON:  July 12, 2014 and July 14, 2014  FINDINGS: Lung bases are clear.  Liver is prominent, measuring 18.9 cm in length. There is a 6 mm cyst near the dome of the liver anteriorly. Liver otherwise appears unremarkable. Gallbladder  is contracted. There is no biliary duct dilatation.  Spleen, pancreas, adrenals appear normal. Kidneys bilaterally show no evidence of mass or hydronephrosis on either side.  In the pelvis, there is a percutaneously placed drain in an area of diverticular abscess located in the sigmoid colon at the level of the superior sacroiliac joint on the left. The abscess has largely drained in the interval since prior study. A small amount of fluid remains within air in the area of the catheter. Specifically, the remaining collapsing abscess measures 2.7 by 2.0 cm compared to 4.3 x 3.5 cm prior to catheter placement. There is mild thickening of the wall of the sigmoid colon in the area of the catheter, probably due to reactive inflammation from the recent abscess and placement of the catheter. There is no new abscess or new fluid collection in the pelvis. Urinary bladder is midline with normal wall thickness. There is no pelvic mass or pelvic ascites.  Elsewhere, appendix is absent. No bowel obstruction. No free air or portal venous air. No new abscess. No ascites or adenopathy in the abdomen or pelvis. The aorta is nonaneurysmal. There are no blastic or lytic bone lesions.  IMPRESSION: Significant interval decrease in the size of a diverticular abscess following percutaneous drain placement in the lateral left lower quadrant. No new abscess. There is mild thickening of the wall of the mid to distal sigmoid in the area of the catheter, consistent with residual inflammation. No new inflammatory focus.  Appendix  absent.  No bowel obstruction.  Liver prominent without focal lesion.   Electronically Signed   By: Lowella Grip M.D.   On: 07/17/2014 13:52   Ir Sinus/fist Tube Chk-non Gi  07/16/2014   CLINICAL DATA:  63 year old with a diverticular abscess. Evaluate for a fistula connection.  EXAM: SINUS TRACT INJECTION/FISTULOGRAM  Physician: Stephan Minister. Anselm Pancoast, MD  FLUOROSCOPY TIME:  18 seconds  MEDICATIONS: 10 mL Omnipaque 300   ANESTHESIA/SEDATION: Moderate sedation time: None  PROCEDURE: Patient was placed prone on the fluoroscopic table. Contrast was injected through the drainage catheter. The contrast was aspirated at the end of the procedure and the drain was flushed with sterile saline.  FINDINGS: Contrast fills a small abscess cavity and there is a connection to the adjacent colon. 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  COMPLICATIONS: None  IMPRESSION: Positive for a fistula connection between the abscess cavity and the colon.   Electronically Signed   By: Markus Daft M.D.   On: 07/16/2014 17:13   Ct Image Guided Drainage By Percutaneous Catheter  07/14/2014   CLINICAL DATA:  63 year old female with sigmoid diverticulitis and micro perforation resulting in left pelvic sidewall abscess. One week trial of intravenous antibiotics has failed to demonstrate significant clinical improvement. Attempted CT-guided drain placement to be performed today. The window is limited.  EXAM: CT IMAGE GUIDED DRAINAGE BY PERCUTANEOUS CATHETER  Date: 07/14/2014  PROCEDURE: 1. CT-guided drain placement Interventional Radiologist:  Criselda Peaches, MD  ANESTHESIA/SEDATION: Moderate (conscious) sedation was used. 2.5 mg Versed, 100 mcg Fentanyl were administered intravenously. The patient's vital signs were monitored continuously by radiology nursing throughout the procedure.  Sedation Time: 40 minutes  TECHNIQUE: Informed consent was obtained from the patient following explanation of the procedure, risks, benefits and alternatives. The patient understands, agrees and consents for the procedure. All questions were addressed. A time out was performed.  A planning axial CT scan was performed. The left pelvic sidewall abscess collection was identified. A suitable skin entry site was selected and marked. Local anesthesia was attained by infiltration with 1% lidocaine. Under CT fluoroscopic guidance, an 18 gauge trocar needle was carefully advanced  along a left trans gluteal approach. With the assistance of a curves 22 gauge Chiba needle, the 18 gauge trocar was successfully advanced into the complex fluid collection. A 0.035 inch wire was then coiled within the fluid and gas collection. The tract was serially dilated to 10 [place] and a [place] all-purpose drainage catheter was advanced over the wire and formed within the collection.  Approximately 10 mL of thick, mildly bloody and purulent fluid was aspirated. Samples were sent for Gram stain and culture. The tube was then gently flushing connected to gravity bag drainage. The tube was secured to the skin with 0 Prolene suture and an adhesive fixation device.  The patient tolerated the procedure well.  COMPLICATIONS: None immediate  IMPRESSION: Successful placement of a 10 French drainage catheter into the left pelvic sidewall abscess. Aspiration yielded 10 mL of thick, mildly bloody purulent fluid. Samples were sent for Gram stain and culture.  Signed,  Criselda Peaches, MD  [facility]  [facility]   Electronically Signed   By: Jacqulynn Cadet M.D.   On: 07/14/2014 14:34    Labs:  CBC:  Recent Labs  07/11/14 1020 07/13/14 0450 07/14/14 2325 07/17/14 0515  WBC 6.9 14.4* 7.8 6.7  HGB 13.9 13.9 12.2 12.2  HCT 39.5 39.3 34.5* 35.3*  PLT  254 347 453* 457*    COAGS:  Recent Labs  07/14/14 0549  INR 1.07    BMP:  Recent Labs  07/10/14 0450 07/11/14 0441 07/13/14 0450 07/14/14 2325  NA 139 141 135* 137  K 2.9* 4.3 4.0 3.8  CL 98 101 96 101  CO2 27 24 22 22   GLUCOSE 127* 104* 131* 119*  BUN 4* 5* 7 5*  CALCIUM 9.1 9.4 9.5 8.8  CREATININE 0.80 0.82 0.97 0.78  GFRNONAA 78* 76* 62* 88*  GFRAA >90 88* 72* >90    LIVER FUNCTION TESTS:  Recent Labs  06/04/14 0845 07/06/14 1435 07/13/14 0450  BILITOT 0.8 0.5 0.8  AST 23 28 17   ALT 16 21 17   ALKPHOS 59 63 78  PROT 7.2 7.0 7.5  ALBUMIN 4.7 4.1 3.3*     Assessment and Plan:  divertic abscess drain in place CT today shows much improvement- no new abscesses Drain inj 10/14: + communication to bowel Will follow for poss dc time May need drain clinic in IR   I spent a total of 15 minutes face to face in clinical consultation/evaluation, greater than 50% of which was counseling/coordinating care for pelvic abscess drain  Signed: Jobe Mutch A 07/17/2014, 3:26 PM

## 2014-07-17 NOTE — Progress Notes (Signed)
Patient ID: Katherine Hopkins, female   DOB: [birth year], 63 y.o.   MRN: [id number]     [address] [place], [place] [address]    Phone: [phone] FAX: [phone]     Subjective: Decrease in drain output.  VSS.  Afebrile.  WBC remains normal.  Pain is about the same as yesterday. Passing flatus, no BM yet.  She is ambulating in hallways.   Objective:  Vital signs:  Filed Vitals:   07/16/14 0558 07/16/14 1300 07/16/14 2225 07/17/14 0539  BP: 119/73 121/88 122/70 147/86  Pulse: 61 66 62 56  Temp: 98.1 F (36.7 C) 97.9 F (36.6 C) 97.5 F (36.4 C) 97.8 F (36.6 C)  TempSrc:  Oral Oral Oral  Resp: 16 18 17 17   Height:      Weight:      SpO2: 99% 94% 97% 97%    Last BM Date: 07/16/14  Intake/Output   Yesterday:  10/14 0701 - 10/15 0700 In: 1480 [P.O.:680; I.V.:800] Out: 10 [Drains:10] This shift:  Total I/O In: 480 [P.O.:480] Out: 2 [Urine:1; Stool:1]   Problem List:   Active Problems:   Diverticulitis of colon  Physical Exam:  General: Pt awake/alert/oriented x4 in no acute distress  Chest: cta. No chest wall pain w good excursion  CV: Pulses intact. Regular rhythm  MS: Normal AROM mjr joints. No obvious deformity  Abdomen: Soft. Nondistended. Mild ttp LLQ. No evidence of peritonitis. No incarcerated hernias.  Ext: SCDs BLE. No mjr edema. No cyanosis  Skin: No petechiae / purpura   Results:   Labs: Results for orders placed during the hospital encounter of 07/06/14 (from the past 48 hour(s))  CBC     Status: Abnormal   Collection Time    07/17/14  5:15 AM      Result Value Ref Range   WBC 6.7  4.0 - 10.5 K/uL   RBC 4.08  3.87 - 5.11 MIL/uL   Hemoglobin 12.2  12.0 - 15.0 g/dL   HCT 35.3 (*) 36.0 - 46.0 %   MCV 86.5  78.0 - 100.0 fL   MCH 29.9  26.0 - 34.0 pg   MCHC 34.6  30.0 - 36.0 g/dL   RDW 13.4  11.5 - 15.5 %   Platelets 457 (*) 150 - 400 K/uL    Imaging / Studies: Ir  Sinus/fist Tube Chk-non Gi  07/16/2014   CLINICAL DATA:  63 year old with a diverticular abscess. Evaluate for a fistula connection.  EXAM: SINUS TRACT INJECTION/FISTULOGRAM  Physician: Stephan Minister. Anselm Pancoast, MD  FLUOROSCOPY TIME:  18 seconds  MEDICATIONS: 10 mL Omnipaque 300  ANESTHESIA/SEDATION: Moderate sedation time: None  PROCEDURE: Patient was placed prone on the fluoroscopic table. Contrast was injected through the drainage catheter. The contrast was aspirated at the end of the procedure and the drain was flushed with sterile saline.  FINDINGS: Contrast fills a small abscess cavity and there is a connection to the adjacent colon. 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  COMPLICATIONS: None  IMPRESSION: Positive for a fistula connection between the abscess cavity and the colon.   Electronically Signed   By: Markus Daft M.D.   On: 07/16/2014 17:13    Medications / Allergies:  Scheduled Meds: . enoxaparin (LOVENOX) injection  40 mg Subcutaneous Q24H  . ertapenem  1 g Intravenous Q24H  . iohexol  25 mL Oral Q1 Hr x 2  . pantoprazole  40  mg Oral QHS   Continuous Infusions: . dextrose 5 % and 0.45% NaCl 1,000 mL with potassium chloride 20 mEq infusion 100 mL/hr at 07/17/14 0545   PRN Meds:.acetaminophen, acetaminophen, fentaNYL, iohexol, ketorolac, ondansetron, sodium chloride  Antibiotics: Anti-infectives   Start     Dose/Rate Route Frequency Ordered Stop   07/14/14 1430  ertapenem (INVANZ) 1 g in sodium chloride 0.9 % 50 mL IVPB     1 g 100 mL/hr over 30 Minutes Intravenous Every 24 hours 07/14/14 1254     07/12/14 1400  piperacillin-tazobactam (ZOSYN) IVPB 3.375 g  Status:  Discontinued     3.375 g 12.5 mL/hr over 240 Minutes Intravenous 3 times per day 07/12/14 1013 07/14/14 1254   07/11/14 1530  amoxicillin-clavulanate (AUGMENTIN) 875-125 MG per tablet 1 tablet  Status:  Discontinued     1 tablet Oral Every 12 hours 07/11/14 1430 07/12/14 1013   07/06/14 2200  piperacillin-tazobactam  (ZOSYN) IVPB 3.375 g  Status:  Discontinued     3.375 g 12.5 mL/hr over 240 Minutes Intravenous Every 8 hours 07/06/14 2121 07/11/14 1430        Assessment/Plan Diverticulitis with microperforation/abscess  S/p IR drain placement 10/12  -Invanz started 10/12  -full started 10/13-->continue until bowel function returns  -follow cultures, negative, "rare candida" should we add an antifungal? -toradol PRN for pain  -reports small amount of vaginal bleeding, no stool. Will watch for now.  -IR injection 10/14 shows fistula between the abscess cavity and the colon -will proceed with a CT of abdomen and pelvis to evaluate for any additional abscesses.   -appreciate GI evaluation.  The patient feels confident in her care at this time.    Erby Pian, [facility] Pager [phone]) For consults and floor pages call [phone]([id number])  07/17/2014 10:46 AM

## 2014-07-17 NOTE — Progress Notes (Signed)
Agree.  Will continue to follow drain output.

## 2014-07-17 NOTE — Progress Notes (Signed)
See my note from earlier in the day. Agree with above assessment and plans.  Katherine Hopkins

## 2014-07-17 NOTE — Progress Notes (Signed)
Subjective: I appreciate the consultation and opinion provided by Dr. Suzan Slick. Patient states this has helped her understanding some aspects of her disease.  The patient states that she feels fine most times but does have intermittent left lower quadrant pain. She's having about 4 small liquid stools per day. No nausea.  Injection study shows communication with the lumen of the colon. It is not clear whether she has a residual abscess cavity or not. This will need to be investigated with CT.  Objective: Vital signs in last 24 hours: Temp:  [97.5 F (36.4 C)-97.9 F (36.6 C)] 97.8 F (36.6 C) (10/15 0539) Pulse Rate:  [56-66] 56 (10/15 0539) Resp:  [17-18] 17 (10/15 0539) BP: (121-147)/(70-88) 147/86 mmHg (10/15 0539) SpO2:  [94 %-97 %] 97 % (10/15 0539) Last BM Date: 07/16/14  Intake/Output from previous day: 10/14 0701 - 10/15 0700 In: 1480 [P.O.:680; I.V.:800] Out: 10 [Drains:10] Intake/Output this shift: Total I/O In: 480 [P.O.:480] Out: 2 [Urine:1; Stool:1]  General appearance: alert and stable. Ambulating independently. Does not look in any distress. GI: abdomen is soft and not distended. She is tender in the left lower quadrant but only to deep palpation. No peritoneal signs. No mass.  Drainage is  10 cc in the last 24 hours.  Lab Results:   Recent Labs  07/14/14 2325 07/17/14 0515  WBC 7.8 6.7  HGB 12.2 12.2  HCT 34.5* 35.3*  PLT 453* 457*   BMET  Recent Labs  07/14/14 2325  NA 137  K 3.8  CL 101  CO2 22  GLUCOSE 119*  BUN 5*  CREATININE 0.78  CALCIUM 8.8   PT/INR No results found for this basename: LABPROT, INR,  in the last 72 hours ABG No results found for this basename: PHART, PCO2, PO2, HCO3,  in the last 72 hours  Studies/Results: Ir Sinus/fist Tube Chk-non Gi  07/16/2014   CLINICAL DATA:  63 year old with a diverticular abscess. Evaluate for a fistula connection.  EXAM: SINUS TRACT INJECTION/FISTULOGRAM  Physician: Stephan Minister. Anselm Pancoast,  MD  FLUOROSCOPY TIME:  18 seconds  MEDICATIONS: 10 mL Omnipaque 300  ANESTHESIA/SEDATION: Moderate sedation time: None  PROCEDURE: Patient was placed prone on the fluoroscopic table. Contrast was injected through the drainage catheter. The contrast was aspirated at the end of the procedure and the drain was flushed with sterile saline.  FINDINGS: Contrast fills a small abscess cavity and there is a connection to the adjacent colon. 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  COMPLICATIONS: None  IMPRESSION: Positive for a fistula connection between the abscess cavity and the colon.   Electronically Signed   By: Markus Daft M.D.   On: 07/16/2014 17:13    Anti-infectives: Anti-infectives   Start     Dose/Rate Route Frequency Ordered Stop   07/14/14 1430  ertapenem (INVANZ) 1 g in sodium chloride 0.9 % 50 mL IVPB     1 g 100 mL/hr over 30 Minutes Intravenous Every 24 hours 07/14/14 1254     07/12/14 1400  piperacillin-tazobactam (ZOSYN) IVPB 3.375 g  Status:  Discontinued     3.375 g 12.5 mL/hr over 240 Minutes Intravenous 3 times per day 07/12/14 1013 07/14/14 1254   07/11/14 1530  amoxicillin-clavulanate (AUGMENTIN) 875-125 MG per tablet 1 tablet  Status:  Discontinued     1 tablet Oral Every 12 hours 07/11/14 1430 07/12/14 1013   07/06/14 2200  piperacillin-tazobactam (ZOSYN) IVPB 3.375 g  Status:  Discontinued     3.375 g 12.5 mL/hr  over 240 Minutes Intravenous Every 8 hours 07/06/14 2121 07/11/14 1430      Assessment/Plan:  Acute sigmoid diverticulitis. Development of left lower quadrant abscess despite bowel rest and IV antibiotics, necessitating drain placement October 12. Clinically improving  Controlled colocutaneous fistula  For today, we will continue [place] diet For today, we will get CT scan abdomen and pelvis with contrast to make sure that we have completely drained all fluid collections.  VTE - lovenox/ambulating a lot   [employer]. Dalbert Batman, M.D., [facility] General and Minimally invasive Surgery Breast and Colorectal Surgery      LOS: 11 days    Kadajah Kjos M 07/17/2014

## 2014-07-18 MED ORDER — FLUCONAZOLE 200 MG PO TABS
400.0000 mg | ORAL_TABLET | Freq: Every day | ORAL | Status: DC
  Administered 2014-07-18 – 2014-07-19 (×2): 400 mg via ORAL
  Filled 2014-07-18 (×2): qty 2

## 2014-07-18 NOTE — Progress Notes (Signed)
Patient ID: Katherine Hopkins, female   DOB: [birth year], 64 y.o.   MRN: [id number]     [address] [place], [place] [address]    Phone: [phone] FAX: [phone]     Subjective: No BM yet, passing flatus, clear liquid output.  Tolerating fulls.  Afebrile.  VSS.    Objective:  Vital signs:  Filed Vitals:   07/17/14 1100 07/17/14 1400 07/17/14 2156 07/18/14 0555  BP: 149/84 138/65 126/71 135/78  Pulse: 64 66 60 54  Temp: 98 F (36.7 C) 98 F (36.7 C) 97.9 F (36.6 C) 97.5 F (36.4 C)  TempSrc: Oral Oral Oral Oral  Resp: 14 16 20 20   Height:      Weight:      SpO2:  99% 95% 94%    Last BM Date: 07/17/14  Intake/Output   Yesterday:  10/15 0701 - 10/16 0700 In: 3315.3 [P.O.:1080; I.V.:2225.3] Out: 28 [Urine:4; Drains:20; Stool:4] This shift:    I/O last 3 completed shifts: In: 4555.3 [P.O.:1520; I.V.:3025.3; Other:10] Out: 38 [Urine:4; Drains:30; Stool:4]    Physical Exam:  General: Pt awake/alert/oriented x4 in no acute distress  Chest: cta. No chest wall pain w good excursion  CV: Pulses intact. Regular rhythm  MS: Normal AROM mjr joints. No obvious deformity  Abdomen: Soft. Nondistended. Non tender. No evidence of peritonitis. No incarcerated hernias.  Ext: SCDs BLE. No mjr edema. No cyanosis  Skin: No petechiae / purpura    Problem List:   Active Problems:   Diverticulitis of colon    Results:   Labs: Results for orders placed during the hospital encounter of 07/06/14 (from the past 48 hour(s))  CBC     Status: Abnormal   Collection Time    07/17/14  5:15 AM      Result Value Ref Range   WBC 6.7  4.0 - 10.5 K/uL   RBC 4.08  3.87 - 5.11 MIL/uL   Hemoglobin 12.2  12.0 - 15.0 g/dL   HCT 35.3 (*) 36.0 - 46.0 %   MCV 86.5  78.0 - 100.0 fL   MCH 29.9  26.0 - 34.0 pg   MCHC 34.6  30.0 - 36.0 g/dL   RDW 13.4  11.5 - 15.5 %   Platelets 457 (*) 150 - 400 K/uL    Imaging /  Studies: Ct Abdomen Pelvis W Contrast  07/17/2014   CLINICAL DATA:  Recent diverticulitis with abscess; percutaneously placed drain in place  EXAM: CT ABDOMEN AND PELVIS WITH CONTRAST  TECHNIQUE: Multidetector CT imaging of the abdomen and pelvis was performed using the standard protocol following bolus administration of intravenous contrast. Oral contrast was also administered.  CONTRAST:  160mL OMNIPAQUE IOHEXOL 300 MG/ML  SOLN  COMPARISON:  July 12, 2014 and July 14, 2014  FINDINGS: Lung bases are clear.  Liver is prominent, measuring 18.9 cm in length. There is a 6 mm cyst near the dome of the liver anteriorly. Liver otherwise appears unremarkable. Gallbladder is contracted. There is no biliary duct dilatation.  Spleen, pancreas, adrenals appear normal. Kidneys bilaterally show no evidence of mass or hydronephrosis on either side.  In the pelvis, there is a percutaneously placed drain in an area of diverticular abscess located in the sigmoid colon at the level of the superior sacroiliac joint on the left. The abscess has largely drained in the interval since prior study. A small amount of fluid remains within air in  the area of the catheter. Specifically, the remaining collapsing abscess measures 2.7 by 2.0 cm compared to 4.3 x 3.5 cm prior to catheter placement. There is mild thickening of the wall of the sigmoid colon in the area of the catheter, probably due to reactive inflammation from the recent abscess and placement of the catheter. There is no new abscess or new fluid collection in the pelvis. Urinary bladder is midline with normal wall thickness. There is no pelvic mass or pelvic ascites.  Elsewhere, appendix is absent. No bowel obstruction. No free air or portal venous air. No new abscess. No ascites or adenopathy in the abdomen or pelvis. The aorta is nonaneurysmal. There are no blastic or lytic bone lesions.  IMPRESSION: Significant interval decrease in the size of a diverticular abscess  following percutaneous drain placement in the lateral left lower quadrant. No new abscess. There is mild thickening of the wall of the mid to distal sigmoid in the area of the catheter, consistent with residual inflammation. No new inflammatory focus.  Appendix absent.  No bowel obstruction.  Liver prominent without focal lesion.   Electronically Signed   By: Lowella Grip M.D.   On: 07/17/2014 13:52   Ir Sinus/fist Tube Chk-non Gi  07/16/2014   CLINICAL DATA:  63 year old with a diverticular abscess. Evaluate for a fistula connection.  EXAM: SINUS TRACT INJECTION/FISTULOGRAM  Physician: Stephan Minister. Anselm Pancoast, MD  FLUOROSCOPY TIME:  18 seconds  MEDICATIONS: 10 mL Omnipaque 300  ANESTHESIA/SEDATION: Moderate sedation time: None  PROCEDURE: Patient was placed prone on the fluoroscopic table. Contrast was injected through the drainage catheter. The contrast was aspirated at the end of the procedure and the drain was flushed with sterile saline.  FINDINGS: Contrast fills a small abscess cavity and there is a connection to the adjacent colon. 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  COMPLICATIONS: None  IMPRESSION: Positive for a fistula connection between the abscess cavity and the colon.   Electronically Signed   By: Markus Daft M.D.   On: 07/16/2014 17:13    Medications / Allergies:  Scheduled Meds: . enoxaparin (LOVENOX) injection  40 mg Subcutaneous Q24H  . ertapenem  1 g Intravenous Q24H  . pantoprazole  40 mg Oral QHS   Continuous Infusions: . dextrose 5 % and 0.45% NaCl 1,000 mL with potassium chloride 20 mEq infusion 100 mL/hr at 07/18/14 0230   PRN Meds:.acetaminophen, acetaminophen, fentaNYL, iohexol, ketorolac, ondansetron, sodium chloride  Antibiotics: Anti-infectives   Start     Dose/Rate Route Frequency Ordered Stop   07/14/14 1430  ertapenem (INVANZ) 1 g in sodium chloride 0.9 % 50 mL IVPB     1 g 100 mL/hr over 30 Minutes Intravenous Every 24 hours 07/14/14 1254     07/12/14 1400   piperacillin-tazobactam (ZOSYN) IVPB 3.375 g  Status:  Discontinued     3.375 g 12.5 mL/hr over 240 Minutes Intravenous 3 times per day 07/12/14 1013 07/14/14 1254   07/11/14 1530  amoxicillin-clavulanate (AUGMENTIN) 875-125 MG per tablet 1 tablet  Status:  Discontinued     1 tablet Oral Every 12 hours 07/11/14 1430 07/12/14 1013   07/06/14 2200  piperacillin-tazobactam (ZOSYN) IVPB 3.375 g  Status:  Discontinued     3.375 g 12.5 mL/hr over 240 Minutes Intravenous Every 8 hours 07/06/14 2121 07/11/14 1430        Assessment/Plan  Diverticulitis with microperforation/abscess  S/p IR drain placement 10/12  -Invanz started 10/12  -full started 10/13-->advance to low residue diet, nutrition  consult -follow cultures, rare candida, will add fluconazole x7 days -toradol PRN for pain  -reports small amount of vaginal bleeding, no stool. Will watch for now.  -IR injection 10/14 shows fistula between the abscess cavity and the colon  -CT abdomen and pelvis 10/15 shows significant decrease in abscess size, no new abscess -will see how she does with advancement in diet, if unable to tolerate, back down to fulls and DC with fulls.  Will change to cipro/flagyl at discharge along with fluconazole for a total of 7 days -anticipate discharge in 1-2 days with drain, follow up in drain clinic -the patient is still deciding, but will likely would like to follow up with Dr. Dalbert Batman  -[facility] order placed for drain care   Erby Pian, [facility] Surgery Pager [phone](7A-4:30P) For consults and floor pages call [phone](7A-4:30P)  07/18/2014 8:37 AM

## 2014-07-18 NOTE — Progress Notes (Signed)
ANTIBIOTIC CONSULT NOTE - INITIAL  Pharmacy Consult for diflucan Indication: diverticulitis  Allergies  Allergen Reactions  . Betadine [Povidone Iodine] Itching  . Codeine Itching    Patient Measurements: Height: 5' 6.5" (168.9 cm) Weight: 153 lb 3.5 oz (69.5 kg) IBW/kg (Calculated) : 60.45   Vital Signs: Temp: 97.5 F (36.4 C) (10/16 0555) Temp Source: Oral (10/16 0555) BP: 135/78 mmHg (10/16 0555) Pulse Rate: 54 (10/16 0555) Intake/Output from previous day: 10/15 0701 - 10/16 0700 In: 3315.3 [P.O.:1080; I.V.:2225.3] Out: 28 [Urine:4; Drains:20; Stool:4] Intake/Output from this shift:    Labs:  Recent Labs  07/17/14 0515  WBC 6.7  HGB 12.2  PLT 457*   Estimated Creatinine Clearance: 70.5 ml/min (by C-G formula based on Cr of 0.78). No results found for this basename: VANCOTROUGH, Corlis Leak, VANCORANDOM, GENTTROUGH, GENTPEAK, GENTRANDOM, TOBRATROUGH, TOBRAPEAK, TOBRARND, AMIKACINPEAK, AMIKACINTROU, AMIKACIN,  in the last 72 hours   Microbiology: Recent Results (from the past 720 hour(s))  URINE CULTURE     Status: None   Collection Time    07/06/14  3:09 PM      Result Value Ref Range Status   Specimen Description URINE, CLEAN CATCH   Final   Special Requests NONE   Final   Culture  Setup Time     Final   Value: 07/06/2014 16:35     Performed at [place]     Final   Value: >=100,000 COLONIES/ML     Performed at [facility]   Culture     Final   Value: VIRIDANS STREPTOCOCCUS     Performed at [facility]   Report Status 07/08/2014 FINAL   Final  CULTURE, ROUTINE-ABSCESS     Status: None   Collection Time    07/14/14 10:22 AM      Result Value Ref Range Status   Specimen Description ABSCESS PERITONEAL CAVITY   Final   Special Requests NONE   Final   Gram Stain     Final   Value: MODERATE WBC PRESENT, PREDOMINANTLY PMN     NO SQUAMOUS EPITHELIAL CELLS SEEN     NO ORGANISMS SEEN     Performed at [facility]   Culture     Final   Value: RARE CANDIDA ALBICANS     Performed at [facility]   Report Status 07/17/2014 FINAL   Final  ANAEROBIC CULTURE     Status: None   Collection Time    07/14/14 10:22 AM      Result Value Ref Range Status   Specimen Description ABSCESS PERITONEAL CAVITY   Final   Special Requests Normal   Final   Gram Stain     Final   Value: ABUNDANT WBC PRESENT,BOTH PMN AND MONONUCLEAR     NO SQUAMOUS EPITHELIAL CELLS SEEN     NO ORGANISMS SEEN     Performed at [facility]   Culture     Final   Value: NO ANAEROBES ISOLATED; CULTURE IN PROGRESS FOR 5 DAYS     Performed at [facility]   Report Status PENDING   Incomplete    Medical History: Past Medical History  Diagnosis Date  . Bulging disc    Assessment: Pharmacy consulted to dose PO diflucan x 7 days for diverticulitis.  63 yo F s/p diverticulitis with microperforation/abscess, S/p IR drain placement 10/12.  CT abdomen and pelvis 10/15 shows significant decrease in abscess size, no new abscess.  Wt  69.5 kg/ creat wnl, wbc 6.7. On day # 5 of ertapenem.   Goal of Therapy:  Eradicate infection  Plan:  -diflucan 400 mg po x 7 days -pharmacy to sign off Thanks  Eudelia Bunch, Pharm.D. [phone] 07/18/2014 9:00 AM

## 2014-07-18 NOTE — Progress Notes (Signed)
Patient ID: Katherine Hopkins, female   DOB: 07-07-1951, 63 y.o.   MRN: [id number]   Referring Physician(s): CCS  Subjective:  divertic abscess TG drain intact Placed 10/12 Inj revealed + communication CT shows good improvement in collection Tolerating increased diet Poss dc home with drain next few days per CCS  Allergies: Betadine and Codeine  Medications: Prior to Admission medications   Medication Sig Start Date End Date Taking? Authorizing Provider  calcium carbonate (OS-CAL) 600 MG TABS tablet Take 600 mg by mouth 2 (two) times daily with a meal.   Yes Historical Provider, MD  cholecalciferol (VITAMIN D) 1000 UNITS tablet Take 1,000 Units by mouth daily.   Yes Historical Provider, MD  glucosamine-chondroitin 500-400 MG tablet Take 1 tablet by mouth 3 (three) times daily.   Yes Historical Provider, MD  Omega-3 Fatty Acids (FISH OIL) 1000 MG CAPS Take 1 capsule by mouth daily.    Yes Historical Provider, MD    Review of Systems  Vital Signs: BP 135/78  Pulse 54  Temp(Src) 97.5 F (36.4 C) (Oral)  Resp 20  Ht 5' 6.5" (1.689 m)  Wt 69.5 kg (153 lb 3.5 oz)  BMI 24.36 kg/m2  SpO2 94%  Physical Exam  Skin:  TG pelvic abscess drain intact Site clean and dry; NT No bleeding Output minimal x 2 days Scant in bag now--bloody Cx: rare candida     Imaging: Ct Abdomen Pelvis W Contrast  07/17/2014   CLINICAL DATA:  Recent diverticulitis with abscess; percutaneously placed drain in place  EXAM: CT ABDOMEN AND PELVIS WITH CONTRAST  TECHNIQUE: Multidetector CT imaging of the abdomen and pelvis was performed using the standard protocol following bolus administration of intravenous contrast. Oral contrast was also administered.  CONTRAST:  110mL OMNIPAQUE IOHEXOL 300 MG/ML  SOLN  COMPARISON:  July 12, 2014 and July 14, 2014  FINDINGS: Lung bases are clear.  Liver is prominent, measuring 18.9 cm in length. There is a 6 mm cyst near the dome of the liver anteriorly. Liver  otherwise appears unremarkable. Gallbladder is contracted. There is no biliary duct dilatation.  Spleen, pancreas, adrenals appear normal. Kidneys bilaterally show no evidence of mass or hydronephrosis on either side.  In the pelvis, there is a percutaneously placed drain in an area of diverticular abscess located in the sigmoid colon at the level of the superior sacroiliac joint on the left. The abscess has largely drained in the interval since prior study. A small amount of fluid remains within air in the area of the catheter. Specifically, the remaining collapsing abscess measures 2.7 by 2.0 cm compared to 4.3 x 3.5 cm prior to catheter placement. There is mild thickening of the wall of the sigmoid colon in the area of the catheter, probably due to reactive inflammation from the recent abscess and placement of the catheter. There is no new abscess or new fluid collection in the pelvis. Urinary bladder is midline with normal wall thickness. There is no pelvic mass or pelvic ascites.  Elsewhere, appendix is absent. No bowel obstruction. No free air or portal venous air. No new abscess. No ascites or adenopathy in the abdomen or pelvis. The aorta is nonaneurysmal. There are no blastic or lytic bone lesions.  IMPRESSION: Significant interval decrease in the size of a diverticular abscess following percutaneous drain placement in the lateral left lower quadrant. No new abscess. There is mild thickening of the wall of the mid to distal sigmoid in the area of the catheter, consistent with  residual inflammation. No new inflammatory focus.  Appendix absent.  No bowel obstruction.  Liver prominent without focal lesion.   Electronically Signed   By: Lowella Grip M.D.   On: 07/17/2014 13:52   Ir Sinus/fist Tube Chk-non Gi  07/16/2014   CLINICAL DATA:  63 year old with a diverticular abscess. Evaluate for a fistula connection.  EXAM: SINUS TRACT INJECTION/FISTULOGRAM  Physician: Stephan Minister. Anselm Pancoast, MD  FLUOROSCOPY TIME:   18 seconds  MEDICATIONS: 10 mL Omnipaque 300  ANESTHESIA/SEDATION: Moderate sedation time: None  PROCEDURE: Patient was placed prone on the fluoroscopic table. Contrast was injected through the drainage catheter. The contrast was aspirated at the end of the procedure and the drain was flushed with sterile saline.  FINDINGS: Contrast fills a small abscess cavity and there is a connection to the adjacent colon. 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  COMPLICATIONS: None  IMPRESSION: Positive for a fistula connection between the abscess cavity and the colon.   Electronically Signed   By: Markus Daft M.D.   On: 07/16/2014 17:13   Ct Image Guided Drainage By Percutaneous Catheter  07/14/2014   CLINICAL DATA:  63 year old female with sigmoid diverticulitis and micro perforation resulting in left pelvic sidewall abscess. One week trial of intravenous antibiotics has failed to demonstrate significant clinical improvement. Attempted CT-guided drain placement to be performed today. The window is limited.  EXAM: CT IMAGE GUIDED DRAINAGE BY PERCUTANEOUS CATHETER  Date: 07/14/2014  PROCEDURE: 1. CT-guided drain placement Interventional Radiologist:  Criselda Peaches, MD  ANESTHESIA/SEDATION: Moderate (conscious) sedation was used. 2.5 mg Versed, 100 mcg Fentanyl were administered intravenously. The patient's vital signs were monitored continuously by radiology nursing throughout the procedure.  Sedation Time: 40 minutes  TECHNIQUE: Informed consent was obtained from the patient following explanation of the procedure, risks, benefits and alternatives. The patient understands, agrees and consents for the procedure. All questions were addressed. A time out was performed.  A planning axial CT scan was performed. The left pelvic sidewall abscess collection was identified. A suitable skin entry site was selected and marked. Local anesthesia was attained by infiltration with 1% lidocaine. Under CT fluoroscopic guidance, an  18 gauge trocar needle was carefully advanced along a left trans gluteal approach. With the assistance of a curves 22 gauge Chiba needle, the 18 gauge trocar was successfully advanced into the complex fluid collection. A 0.035 inch wire was then coiled within the fluid and gas collection. The tract was serially dilated to 10 [place] and a [place] all-purpose drainage catheter was advanced over the wire and formed within the collection.  Approximately 10 mL of thick, mildly bloody and purulent fluid was aspirated. Samples were sent for Gram stain and culture. The tube was then gently flushing connected to gravity bag drainage. The tube was secured to the skin with 0 Prolene suture and an adhesive fixation device.  The patient tolerated the procedure well.  COMPLICATIONS: None immediate  IMPRESSION: Successful placement of a 10 French drainage catheter into the left pelvic sidewall abscess. Aspiration yielded 10 mL of thick, mildly bloody purulent fluid. Samples were sent for Gram stain and culture.  Signed,  Criselda Peaches, MD  [facility]  [facility]   Electronically Signed   By: Jacqulynn Cadet M.D.   On: 07/14/2014 14:34    Labs:  CBC:  Recent Labs  07/11/14 1020 07/13/14 0450 07/14/14 2325 07/17/14 0515  WBC 6.9 14.4* 7.8 6.7  HGB 13.9 13.9 12.2 12.2  HCT 39.5 39.3 34.5* 35.3*  PLT 254 347 453* 457*    COAGS:  Recent Labs  07/14/14 0549  INR 1.07    BMP:  Recent Labs  07/10/14 0450 07/11/14 0441 07/13/14 0450 07/14/14 2325  NA 139 141 135* 137  K 2.9* 4.3 4.0 3.8  CL 98 101 96 101  CO2 27 24 22 22   GLUCOSE 127* 104* 131* 119*  BUN 4* 5* 7 5*  CALCIUM 9.1 9.4 9.5 8.8  CREATININE 0.80 0.82 0.97 0.78  GFRNONAA 78* 76* 62* 88*  GFRAA >90 88* 72* >90    LIVER FUNCTION TESTS:  Recent Labs  06/04/14 0845 07/06/14 1435 07/13/14 0450  BILITOT 0.8 0.5 0.8  AST 23 28 17   ALT 16 21 17   ALKPHOS 59 63 78  PROT  7.2 7.0 7.5  ALBUMIN 4.7 4.1 3.3*    Assessment and Plan:  Pelvic TG divertic abscess drain  For poss dc too home with drain next few days Will plan for drain clinic  She will hear from IR scheduler with time and date   I spent a total of 20 minutes face to face in clinical consultation/evaluation, greater than 50% of which was counseling/coordinating care for abscess drain  Signed: Amory Zbikowski A 07/18/2014, 9:31 AM

## 2014-07-18 NOTE — Plan of Care (Signed)
Problem: Food- and Nutrition-Related Knowledge Deficit (NB-1.1) Goal: Nutrition education Formal process to instruct or train a patient/client in a skill or to impart knowledge to help patients/clients voluntarily manage or modify food choices and eating behavior to maintain or improve health. Outcome: Adequate for Discharge Nutrition Education Note  RD consulted for nutrition education regarding a low residue diet.    RD provided "Fiber Restricted Nutrition Therapy" handout from the Academy of [employer]etics. Reviewed patient's dietary recall.  Educated pt on principles and rationale for low residue diet. Discussed importance of choosing low fiber food sources and reviewed examples of high fiber foods to avoid. Teach back method used.  Expect very good compliance.  Body mass index is 24.36 kg/(m^2). Pt meets criteria for normal weight rangr based on current BMI.  Current diet order is Soft, patient is consuming approximately 100% of meals at this time. Labs and medications reviewed. No further nutrition interventions warranted at this time. RD contact information provided. If additional nutrition issues arise, please re-consult RD.  Katherine Hopkins A. Jimmye Norman, RD, LDN Pager: [phone] After hours Pager: [phone]

## 2014-07-18 NOTE — Progress Notes (Signed)
Gen. Surgery attending:  Patient is doing well. Has had no pain today at rest or ambulating. She has been advanced to a low-residue soft diet and has been instructed.  If she does well with a low-residue diet, consider discharge tomorrow She should be placed on Cipro, Flagyl, and Diflucan for 7-10 days   repeat drain injection study in 6-7 days She requests a followup with me in the office, perhaps one day after the injection study    Spike Desilets M. Dalbert Batman, M.D., [facility] General and Minimally invasive Surgery Breast and Colorectal Surgery Office:   [phone] Pager:   [phone]

## 2014-07-18 NOTE — Care Management Note (Signed)
  Page 1 of 1   07/18/2014     10:07:52 AM CARE MANAGEMENT NOTE 07/18/2014  Patient:  Katherine Hopkins, Katherine Hopkins   Account Number:  [id number]  Date Initiated:  07/17/2014  Documentation initiated by:  Magdalen Spatz  Subjective/Objective Assessment:     Action/Plan:   Anticipated DC Date:  07/19/2014   Anticipated DC Plan:  [place]         Choice offered to / List presented to:  C-1 Patient        [place] arranged  HH-1 RN      [place].   Status of service:  Completed, signed off Medicare Important Message given?   (If response is "NO", the following Medicare IM given date fields will be blank) Date Medicare IM given:   Medicare IM given by:   Date Additional Medicare IM given:   Additional Medicare IM given by:    Discharge Disposition:    Per UR Regulation:    If discussed at Long Length of Stay Meetings, dates discussed:   07/17/2014    Comments:  07-18-14 Patient discharging in 1 to 2 days . HHRN for drain flushes . Asked bedside nurse to give patient some fllushes for home . Patient will need prescription for flushes at discharge. Patient aware HHRN will continue education on drain and will not be there every time drain needs to be flushed.  Confirmed face sheet information with patient. Magdalen Spatz RN BSN [phone]

## 2014-07-18 NOTE — Progress Notes (Signed)
Social visit.  Patient states she's doing better.  I reiterated my opinion that, although there is no universal opinion regarding this, I think she would be better off with an elective resection of her sigmoid region once she recovers from the current illness, because there is a fairly high probability, given her young age, that she will continue to experience diverticulitis attacks in the future.  Cleotis Nipper, M.D. [phone]

## 2014-07-19 LAB — BASIC METABOLIC PANEL
Anion gap: 13 (ref 5–15)
BUN: 6 mg/dL (ref 6–23)
CO2: 27 mEq/L (ref 19–32)
Calcium: 9.6 mg/dL (ref 8.4–10.5)
Chloride: 101 mEq/L (ref 96–112)
Creatinine, Ser: 0.86 mg/dL (ref 0.50–1.10)
GFR calc Af Amer: 83 mL/min — ABNORMAL LOW (ref >90)
GFR, EST NON AFRICAN AMERICAN: 71 mL/min — AB (ref >90)
Glucose, Bld: 100 mg/dL — ABNORMAL HIGH (ref 70–99)
Potassium: 4.3 mEq/L (ref 3.7–5.3)
SODIUM: 141 meq/L (ref 137–147)

## 2014-07-19 LAB — ANAEROBIC CULTURE: SPECIAL REQUESTS: NORMAL

## 2014-07-19 LAB — CBC
HCT: 36.5 % (ref 36.0–46.0)
HEMOGLOBIN: 12.4 g/dL (ref 12.0–15.0)
MCH: 29.5 pg (ref 26.0–34.0)
MCHC: 34 g/dL (ref 30.0–36.0)
MCV: 86.7 fL (ref 78.0–100.0)
Platelets: 500 10*3/uL — ABNORMAL HIGH (ref 150–400)
RBC: 4.21 MIL/uL (ref 3.87–5.11)
RDW: 13.4 % (ref 11.5–15.5)
WBC: 8.7 10*3/uL (ref 4.0–10.5)

## 2014-07-19 MED ORDER — FLUCONAZOLE 200 MG PO TABS: 400.0000 mg | ORAL_TABLET | Freq: Every day | ORAL | Status: DC

## 2014-07-19 MED ORDER — NAPROXEN SODIUM 220 MG PO TABS: 220.0000 mg | ORAL_TABLET | Freq: Two times a day (BID) | ORAL | Status: AC | PRN

## 2014-07-19 MED ORDER — CIPROFLOXACIN HCL 500 MG PO TABS
500.0000 mg | ORAL_TABLET | Freq: Two times a day (BID) | ORAL | Status: DC
  Administered 2014-07-19: 500 mg via ORAL
  Filled 2014-07-19: qty 1

## 2014-07-19 MED ORDER — CIPROFLOXACIN HCL 500 MG PO TABS: 500.0000 mg | ORAL_TABLET | Freq: Two times a day (BID) | ORAL | Status: DC

## 2014-07-19 MED ORDER — ACETAMINOPHEN 325 MG PO TABS: 650.0000 mg | ORAL_TABLET | Freq: Four times a day (QID) | ORAL | Status: DC | PRN

## 2014-07-19 MED ORDER — METRONIDAZOLE 500 MG PO TABS
500.0000 mg | ORAL_TABLET | Freq: Three times a day (TID) | ORAL | Status: DC
  Administered 2014-07-19: 500 mg via ORAL
  Filled 2014-07-19: qty 1

## 2014-07-19 MED ORDER — SACCHAROMYCES BOULARDII 250 MG PO CAPS: 250.0000 mg | ORAL_CAPSULE | Freq: Two times a day (BID) | ORAL | Status: DC

## 2014-07-19 MED ORDER — METRONIDAZOLE 500 MG PO TABS: 500.0000 mg | ORAL_TABLET | Freq: Three times a day (TID) | ORAL | Status: DC

## 2014-07-19 MED ORDER — SACCHAROMYCES BOULARDII 250 MG PO CAPS
250.0000 mg | ORAL_CAPSULE | Freq: Two times a day (BID) | ORAL | Status: DC
  Administered 2014-07-19: 250 mg via ORAL
  Filled 2014-07-19 (×2): qty 1

## 2014-07-19 NOTE — Progress Notes (Signed)
Subjective: Pt had some pain last PM, and she has some discomfort with the drain irrigation at about the 3 cc mark. Home health is saying they will only give her one visit and no supplies if she doesn't stay at home.  So she has allot of questions to answer, before going home.  She would like to go home.  She is set up to return to drain clinic for CT and drain injection on 07/29/14.  Objective: Vital signs in last 24 hours: Temp:  [97.9 F (36.6 C)-98.1 F (36.7 C)] 98.1 F (36.7 C) (10/17 0554) Pulse Rate:  [58-68] 58 (10/17 0554) Resp:  [18] 18 (10/17 0554) BP: (126-139)/(73-90) 136/80 mmHg (10/17 0554) SpO2:  [95 %-97 %] 95 % (10/17 0554) Last BM Date: 07/18/14 600 PO recorded Nothing from the drain no Bm recorded. Afebrile, VSS Labs all look good. Intake/Output from previous day: 10/16 0701 - 10/17 0700 In: 1123 [P.O.:600; I.V.:523] Out: 0  Intake/Output this shift: Total I/O In: 100 [P.O.:100] Out: -   General appearance: alert, cooperative and no distress Resp: clear to auscultation bilaterally GI: soft, non-tender; bowel sounds normal; no masses,  no organomegaly and still a bit sore LLQ  Lab Results:   Recent Labs  07/17/14 0515 07/19/14 0500  WBC 6.7 8.7  HGB 12.2 12.4  HCT 35.3* 36.5  PLT 457* 500*    BMET  Recent Labs  07/19/14 0500  NA 141  K 4.3  CL 101  CO2 27  GLUCOSE 100*  BUN 6  CREATININE 0.86  CALCIUM 9.6   PT/INR No results found for this basename: LABPROT, INR,  in the last 72 hours   Recent Labs Lab 07/13/14 0450  AST 17  ALT 17  ALKPHOS 78  BILITOT 0.8  PROT 7.5  ALBUMIN 3.3*     Lipase  No results found for this basename: lipase     Studies/Results: Ct Abdomen Pelvis W Contrast  07/17/2014   CLINICAL DATA:  Recent diverticulitis with abscess; percutaneously placed drain in place  EXAM: CT ABDOMEN AND PELVIS WITH CONTRAST  TECHNIQUE: Multidetector CT imaging of the abdomen and pelvis was performed using the  standard protocol following bolus administration of intravenous contrast. Oral contrast was also administered.  CONTRAST:  11mL OMNIPAQUE IOHEXOL 300 MG/ML  SOLN  COMPARISON:  July 12, 2014 and July 14, 2014  FINDINGS: Lung bases are clear.  Liver is prominent, measuring 18.9 cm in length. There is a 6 mm cyst near the dome of the liver anteriorly. Liver otherwise appears unremarkable. Gallbladder is contracted. There is no biliary duct dilatation.  Spleen, pancreas, adrenals appear normal. Kidneys bilaterally show no evidence of mass or hydronephrosis on either side.  In the pelvis, there is a percutaneously placed drain in an area of diverticular abscess located in the sigmoid colon at the level of the superior sacroiliac joint on the left. The abscess has largely drained in the interval since prior study. A small amount of fluid remains within air in the area of the catheter. Specifically, the remaining collapsing abscess measures 2.7 by 2.0 cm compared to 4.3 x 3.5 cm prior to catheter placement. There is mild thickening of the wall of the sigmoid colon in the area of the catheter, probably due to reactive inflammation from the recent abscess and placement of the catheter. There is no new abscess or new fluid collection in the pelvis. Urinary bladder is midline with normal wall thickness. There is no pelvic mass or pelvic ascites.  Elsewhere, appendix is absent. No bowel obstruction. No free air or portal venous air. No new abscess. No ascites or adenopathy in the abdomen or pelvis. The aorta is nonaneurysmal. There are no blastic or lytic bone lesions.  IMPRESSION: Significant interval decrease in the size of a diverticular abscess following percutaneous drain placement in the lateral left lower quadrant. No new abscess. There is mild thickening of the wall of the mid to distal sigmoid in the area of the catheter, consistent with residual inflammation. No new inflammatory focus.  Appendix absent.  No  bowel obstruction.  Liver prominent without focal lesion.   Electronically Signed   By: Lowella Grip M.D.   On: 07/17/2014 13:52    Medications: . enoxaparin (LOVENOX) injection  40 mg Subcutaneous Q24H  . ertapenem  1 g Intravenous Q24H  . fluconazole  400 mg Oral Daily  . pantoprazole  40 mg Oral QHS   . dextrose 5 % and 0.45% NaCl 1,000 mL with potassium chloride 20 mEq infusion 100 mL/hr at 07/19/14 1138   Prior to Admission medications   Medication Sig Start Date End Date Taking? Authorizing Provider  calcium carbonate (OS-CAL) 600 MG TABS tablet Take 600 mg by mouth 2 (two) times daily with a meal.   Yes Historical Provider, MD  cholecalciferol (VITAMIN D) 1000 UNITS tablet Take 1,000 Units by mouth daily.   Yes Historical Provider, MD  glucosamine-chondroitin 500-400 MG tablet Take 1 tablet by mouth 3 (three) times daily.   Yes Historical Provider, MD  Omega-3 Fatty Acids (FISH OIL) 1000 MG CAPS Take 1 capsule by mouth daily.    Yes Historical Provider, MD     Assessment/Plan Diverticulitis with microperforation/abscess  S/p IR drain placement 10/12  Hx of disc disease  She should be placed on Cipro, Flagyl, and Diflucan for 7-10 days  repeat drain injection study in 6-7 days  Plan;  I will talk to Dr. Dalbert Batman and make final arrangements for d/c.Marland Kitchen She also has a PICC line and I would like to take that out also.     LOS: 13 days    Ellamarie Naeve 07/19/2014

## 2014-07-19 NOTE — Discharge Instructions (Signed)
Diverticulitis Diverticulitis is inflammation or infection of small pouches in your colon that form when you have a condition called diverticulosis. The pouches in your colon are called diverticula. Your colon, or large intestine, is where water is absorbed and stool is formed. Complications of diverticulitis can include:  Bleeding.  Severe infection.  Severe pain.  Perforation of your colon.  Obstruction of your colon. CAUSES  Diverticulitis is caused by bacteria. Diverticulitis happens when stool becomes trapped in diverticula. This allows bacteria to grow in the diverticula, which can lead to inflammation and infection. RISK FACTORS People with diverticulosis are at risk for diverticulitis. Eating a diet that does not include enough fiber from fruits and vegetables may make diverticulitis more likely to develop. SYMPTOMS  Symptoms of diverticulitis may include:  Abdominal pain and tenderness. The pain is normally located on the left side of the abdomen, but may occur in other areas.  Fever and chills.  Bloating.  Cramping.  Nausea.  Vomiting.  Constipation.  Diarrhea.  Blood in your stool. DIAGNOSIS  Your health care provider will ask you about your medical history and do a physical exam. You may need to have tests done because many medical conditions can cause the same symptoms as diverticulitis. Tests may include:  Blood tests.  Urine tests.  Imaging tests of the abdomen, including X-rays and CT scans. When your condition is under control, your health care provider may recommend that you have a colonoscopy. A colonoscopy can show how severe your diverticula are and whether something else is causing your symptoms. TREATMENT  Most cases of diverticulitis are mild and can be treated at home. Treatment may include:  Taking over-the-counter pain medicines.  Following a clear liquid diet.  Taking antibiotic medicines by mouth for 7-10 days. More severe cases may  be treated at a hospital. Treatment may include:  Not eating or drinking.  Taking prescription pain medicine.  Receiving antibiotic medicines through an IV tube.  Receiving fluids and nutrition through an IV tube.  Surgery. HOME CARE INSTRUCTIONS   Follow your health care provider's instructions carefully.  Follow a full liquid diet or other diet as directed by your health care provider. After your symptoms improve, your health care provider may tell you to change your diet. He or she may recommend you eat a high-fiber diet. Fruits and vegetables are good sources of fiber. Fiber makes it easier to pass stool.  Take fiber supplements or probiotics as directed by your health care provider.  Only take medicines as directed by your health care provider.  Keep all your follow-up appointments. SEEK MEDICAL CARE IF:   Your pain does not improve.  You have a hard time eating food.  Your bowel movements do not return to normal. SEEK IMMEDIATE MEDICAL CARE IF:   Your pain becomes worse.  Your symptoms do not get better.  Your symptoms suddenly get worse.  You have a fever.  You have repeated vomiting.  You have bloody or black, tarry stools. MAKE SURE YOU:   Understand these instructions.  Will watch your condition.  Will get help right away if you are not doing well or get worse. Document Released: 06/29/2005 Document Revised: 09/24/2013 Document Reviewed: 08/14/2013 [facility] Patient Information 2015 [place], [place]. This information is not intended to replace advice given to you by your health care provider. Make sure you discuss any questions you have with your health care provider.  Irrigate the drain 3 times a day using normal saline.  You can  use up to 10 ml with each flush, but if it causes discomfort stop when you feel the discomfort. You can wash around the drain with soap and water.  No showering, bathing or swimming with the drain in.

## 2014-07-19 NOTE — Progress Notes (Signed)
Gen. Surgery attending:  I have interviewed and examined the patient. Agree with above.  Discharge home on salt, low fiber diet Discharge home on 10 days of Cipro, Flagyl, and Diflucan The patient has been educated in drain care and flushing and record-keeping She will see me in the office in one week She has an appointment with interventional radiology for CT and drain injection in 2 weeks.  Long-term plan will certainly need to include elective one stage resection, if patient is willing.  Edsel Petrin. Dalbert Batman, M.D., [facility] General and Minimally invasive Surgery Breast and Colorectal Surgery Office:   [phone] Pager:   [phone]

## 2014-07-19 NOTE — Discharge Summary (Signed)
Patient dc to home. Patient walked out with family.

## 2014-07-19 NOTE — Discharge Summary (Signed)
Physician Discharge Summary  Patient ID: Katherine Hopkins MRN: [id number] DOB/AGE: 61-Feb-1952 61 y.o.  Admit date: 07/06/2014 Discharge date: 07/21/2014  Admission Diagnoses:  Diverticulitis with microperforation/abscess  Hx of disc disease   Discharge Diagnoses:  Same Active Problems:   Diverticulitis of colon with perforation   PROCEDURES:   Placement of 74F drain into LLQ diverticular abscess via LEFT transgluteal approach. 10 mL purulent fluid aspirated and sent for Cx, 07/14/2014 , Jacqulynn Cadet, MD     Hospital Course: 36 yof who presents with acute onset of left lower abdominal pain that she has never had before. This was not going away at home. She was passing flatus and having bms earlier today. She has no fever. She was a little nauseated but no emesis. She has up to date colonoscopies by her report.  Pt seen and admitted by Dr. Donne Hazel. We ask IR to see and attempt drainage, but it was not initially possible to drain. She was frustrated with the process of the treatment and  progress early in her hospital course.   She was maintained on antibiotics.  She had some issues with hypokalemia and ongoing pain/discomfort in her course also.  She failed her first try at Full liquids on 07/12/14.  She underwent Percutaneous drainage on 07/14/14 by Dr. Laurence Ferrari. Her antibiotics were changed on 07/14/14.   She was seen on 07/16/14, by Dr. Cristina Gong, GI service in consult.  She has made some slow but steady progress;  by 07/19/14 she was ready to go home.  She had minimal drainage from the percutaneous drain.  Pain was controlled and she was tolerating a regular low fiber diet. She will see Dr. Dalbert Batman next week and follow up drain study and CT on 07/29/14.  She has had teaching for use and treatment with the drain.  She did not wish to have narcotics for home use.  condition on d/c:  Improving   Disposition: 01-Home or Self Care     Medication List         acetaminophen 325  MG tablet  Commonly known as:  TYLENOL  Take 2 tablets (650 mg total) by mouth every 6 (six) hours as needed for mild pain (or Temp > 100).     calcium carbonate 600 MG Tabs tablet  Commonly known as:  OS-CAL  Take 600 mg by mouth 2 (two) times daily with a meal.     cholecalciferol 1000 UNITS tablet  Commonly known as:  VITAMIN D  Take 1,000 Units by mouth daily.     ciprofloxacin 500 MG tablet  Commonly known as:  CIPRO  Take 1 tablet (500 mg total) by mouth 2 (two) times daily.     Fish Oil 1000 MG Caps  Take 1 capsule by mouth daily.     fluconazole 200 MG tablet  Commonly known as:  DIFLUCAN  Take 2 tablets (400 mg total) by mouth daily.     glucosamine-chondroitin 500-400 MG tablet  Take 1 tablet by mouth 3 (three) times daily.     metroNIDAZOLE 500 MG tablet  Commonly known as:  FLAGYL  Take 1 tablet (500 mg total) by mouth every 8 (eight) hours.     naproxen sodium 220 MG tablet  Commonly known as:  ALEVE  Take 1 tablet (220 mg total) by mouth 2 (two) times daily as needed.     saccharomyces boulardii 250 MG capsule  Commonly known as:  FLORASTOR  Take 1 capsule (250 mg total) by mouth  2 (two) times daily.       Follow-up Information   Call Adin Hector, MD. (Office should call you with an appointment.  If you don't hear by 2 PM on Monday call and ask to be seen on Thursday or Friday next week.  10/22 or 07/25/14.)    Specialty:  General Surgery   Contact information:   [address] [place] [address] [phone]       Signed: Earnstine Regal 07/21/2014, 3:18 PM

## 2014-07-19 NOTE — Progress Notes (Signed)
Consulted with Care management for flush needs. Pt educated on discharge instruction. Pt awaiting PICC line removal for discharge.

## 2014-07-21 ENCOUNTER — Encounter (HOSPITAL_COMMUNITY): Payer: Self-pay | Admitting: General Surgery

## 2014-07-21 DIAGNOSIS — K5720 Diverticulitis of large intestine with perforation and abscess without bleeding: Secondary | ICD-10-CM | POA: Diagnosis present

## 2014-07-21 HISTORY — DX: Diverticulitis of large intestine with perforation and abscess without bleeding: K57.20

## 2014-07-21 NOTE — Discharge Summary (Signed)
Agree with discharge summary and followup plan   Edsel Petrin. Dalbert Batman, M.D., [facility] General and Minimally invasive Surgery Breast and Colorectal Surgery Office:   [phone] Pager:   [phone]

## 2014-07-25 ENCOUNTER — Ambulatory Visit (INDEPENDENT_AMBULATORY_CARE_PROVIDER_SITE_OTHER): Payer: BC Managed Care – PPO | Admitting: Gynecology

## 2014-07-25 ENCOUNTER — Encounter: Payer: Self-pay | Admitting: Gynecology

## 2014-07-25 VITALS — BP 150/90

## 2014-07-25 DIAGNOSIS — N950 Postmenopausal bleeding: Secondary | ICD-10-CM

## 2014-07-25 DIAGNOSIS — Z8719 Personal history of other diseases of the digestive system: Secondary | ICD-10-CM

## 2014-07-25 MED ORDER — CLINDAMYCIN PHOSPHATE 2 % VA CREA: TOPICAL_CREAM | VAGINAL | Status: DC

## 2014-07-27 NOTE — Progress Notes (Signed)
   Patient is a 63 year old who had been admitted to the hospital on October 4 releasing October 19 as a result of her diverticulitis with microperforation and development of abscess. She was followed by general surgeon Dr. Fanny Skates patient had undergone percutaneous drainage on 07/14/2014 and been placed on antibiotics. She is also been followed by the gastroenterologist and she currently has a drain in place. The reason for visit today was that she thought she had a watery pinkish discharge per vagina the past several days. Patient is menopausal patient is on no hormone replacement therapy.  Exam: Bartholin urethral Skene glands within normal limits Vagina: No drainage or lesions seen. Cervix: No lesions or discharge Uterus: Anteverted normal size shape and consistency and nontender Adnexa: No palpable mass or tenderness Rectal exam not done due to recent abscess and percutaneous drainage  Her cervix was cleansed with Betadine solution and an endometrial biopsy was obtained in the event that the bleeding episode that she had described but have not come from a uterine source. Minimal tissue was obtained and was admitted for histological evaluation.  We then placed a gauze into the vagina had her walk around the office and return back to the examining room the sponges were removed and the appear to be dry.  Assessment/plan: Patient status post diverticular abscess per currently with percutaneous drainage can rule out the possibility of a small colovaginal fistula although was not able to be determined on exam today. I have explained to the patient that she should be allowed for this to handle and then if this continues to be an issue that we can work currently with the general surgeon to identify indeed if there is a colovaginal fistula. She will be placed on Cleocin vaginal cream to apply daily at bedtime for the next 2 weeks. She will follow up with a general surgeon.

## 2014-07-29 ENCOUNTER — Ambulatory Visit (HOSPITAL_COMMUNITY)
Admit: 2014-07-29 | Discharge: 2014-07-29 | Disposition: A | Discharge status: Home / Self Care | Payer: BC Managed Care – PPO | Attending: Interventional Radiology | Admitting: Interventional Radiology

## 2014-07-29 ENCOUNTER — Ambulatory Visit (HOSPITAL_COMMUNITY)
Admission: RE | Admit: 2014-07-29 | Discharge: 2014-07-29 | Disposition: A | Discharge status: Home / Self Care | Payer: BC Managed Care – PPO | Source: Ambulatory Visit | Attending: Interventional Radiology | Admitting: Interventional Radiology

## 2014-07-29 DIAGNOSIS — Z9889 Other specified postprocedural states: Secondary | ICD-10-CM

## 2014-07-29 DIAGNOSIS — Z09 Encounter for follow-up examination after completed treatment for conditions other than malignant neoplasm: Secondary | ICD-10-CM | POA: Insufficient documentation

## 2014-07-29 DIAGNOSIS — Z8719 Personal history of other diseases of the digestive system: Secondary | ICD-10-CM | POA: Diagnosis not present

## 2014-08-04 ENCOUNTER — Encounter: Payer: Self-pay | Admitting: Gynecology

## 2014-09-17 ENCOUNTER — Telehealth (INDEPENDENT_AMBULATORY_CARE_PROVIDER_SITE_OTHER): Payer: Self-pay

## 2014-09-17 ENCOUNTER — Other Ambulatory Visit (INDEPENDENT_AMBULATORY_CARE_PROVIDER_SITE_OTHER): Payer: Self-pay

## 2014-09-17 DIAGNOSIS — Z8719 Personal history of other diseases of the digestive system: Secondary | ICD-10-CM

## 2014-09-17 NOTE — Telephone Encounter (Signed)
Pt s/p diverticulitis seen in the hospital back in October. Pt states that since 09/14/14 she has noticed some tenderness on her left side that has gradationally gotten worse. She states that she has also had some gas. Pt denies any n/v, fevers or chills. Pt states that her BM's have been fairly normal. Pt states that she is scheduled to see her GI dr next week and would like to know if she can have a abx until she sees her GI dr next week. Informed pt that I would send Dr Dalbert Batman a message and we would contact her as soon as we receive a response. Pt verbalized understanding

## 2014-09-19 ENCOUNTER — Ambulatory Visit
Admission: RE | Admit: 2014-09-19 | Discharge: 2014-09-19 | Disposition: A | Discharge status: Home / Self Care | Payer: BC Managed Care – PPO | Source: Ambulatory Visit | Attending: General Surgery | Admitting: General Surgery

## 2015-05-05 ENCOUNTER — Other Ambulatory Visit (HOSPITAL_COMMUNITY): Payer: Self-pay | Admitting: Obstetrics & Gynecology

## 2015-05-05 ENCOUNTER — Telehealth: Payer: Self-pay | Admitting: *Deleted

## 2015-05-05 DIAGNOSIS — Z1231 Encounter for screening mammogram for malignant neoplasm of breast: Secondary | ICD-10-CM

## 2015-05-05 NOTE — Telephone Encounter (Signed)
Sydell Axon called from [place] office requesting last date of tetanus shot I called and left her a message on voicemail at [phone] no record of shot.

## 2015-05-06 ENCOUNTER — Ambulatory Visit (HOSPITAL_COMMUNITY)
Admission: RE | Admit: 2015-05-06 | Discharge: 2015-05-06 | Disposition: A | Discharge status: Home / Self Care | Payer: BLUE CROSS/BLUE SHIELD | Source: Ambulatory Visit | Attending: Obstetrics & Gynecology | Admitting: Obstetrics & Gynecology

## 2015-05-06 DIAGNOSIS — Z1231 Encounter for screening mammogram for malignant neoplasm of breast: Secondary | ICD-10-CM | POA: Diagnosis not present

## 2015-05-22 ENCOUNTER — Telehealth: Payer: Self-pay

## 2015-05-22 NOTE — Telephone Encounter (Signed)
Patient called inquiring if she had tetanus vaccine here 2 years ago. I called her back and per DPR access note I left a message that it is not in her med record and as a matter of fact Dr. Loetta Rough even noted in her 2014 CE note that she is up to date on her TDAP.

## 2015-05-28 ENCOUNTER — Other Ambulatory Visit: Payer: Self-pay

## 2015-05-29 LAB — CYTOLOGY - PAP

## 2016-09-15 DIAGNOSIS — M25522 Pain in left elbow: Secondary | ICD-10-CM | POA: Insufficient documentation

## 2016-09-15 DIAGNOSIS — M7702 Medial epicondylitis, left elbow: Secondary | ICD-10-CM | POA: Insufficient documentation

## 2017-02-15 ENCOUNTER — Encounter: Payer: Self-pay | Admitting: Gynecology

## 2017-04-18 ENCOUNTER — Ambulatory Visit: Payer: Self-pay | Admitting: Pediatrics

## 2017-05-30 ENCOUNTER — Other Ambulatory Visit: Payer: Self-pay | Admitting: Obstetrics & Gynecology

## 2017-05-30 DIAGNOSIS — Z1231 Encounter for screening mammogram for malignant neoplasm of breast: Secondary | ICD-10-CM

## 2017-06-07 ENCOUNTER — Ambulatory Visit
Admission: RE | Admit: 2017-06-07 | Discharge: 2017-06-07 | Disposition: A | Discharge status: Home / Self Care | Payer: Managed Care, Other (non HMO) | Source: Ambulatory Visit | Attending: Obstetrics & Gynecology | Admitting: Obstetrics & Gynecology

## 2017-06-07 DIAGNOSIS — Z1231 Encounter for screening mammogram for malignant neoplasm of breast: Secondary | ICD-10-CM

## 2018-05-22 DIAGNOSIS — R040 Epistaxis: Secondary | ICD-10-CM | POA: Insufficient documentation

## 2018-05-22 DIAGNOSIS — H6121 Impacted cerumen, right ear: Secondary | ICD-10-CM | POA: Insufficient documentation

## 2018-05-22 DIAGNOSIS — K146 Glossodynia: Secondary | ICD-10-CM | POA: Insufficient documentation

## 2018-06-28 DIAGNOSIS — M1612 Unilateral primary osteoarthritis, left hip: Secondary | ICD-10-CM | POA: Insufficient documentation

## 2018-07-24 ENCOUNTER — Other Ambulatory Visit: Payer: Self-pay | Admitting: Obstetrics & Gynecology

## 2018-07-24 DIAGNOSIS — Z1231 Encounter for screening mammogram for malignant neoplasm of breast: Secondary | ICD-10-CM

## 2018-09-03 ENCOUNTER — Ambulatory Visit
Admission: RE | Admit: 2018-09-03 | Discharge: 2018-09-03 | Disposition: A | Discharge status: Home / Self Care | Payer: Managed Care, Other (non HMO) | Source: Ambulatory Visit | Attending: Obstetrics & Gynecology | Admitting: Obstetrics & Gynecology

## 2018-09-03 DIAGNOSIS — Z1231 Encounter for screening mammogram for malignant neoplasm of breast: Secondary | ICD-10-CM

## 2019-06-14 DIAGNOSIS — I10 Essential (primary) hypertension: Secondary | ICD-10-CM | POA: Insufficient documentation

## 2019-07-12 ENCOUNTER — Other Ambulatory Visit: Payer: Self-pay

## 2019-07-12 DIAGNOSIS — Z20822 Contact with and (suspected) exposure to covid-19: Secondary | ICD-10-CM

## 2019-07-13 LAB — NOVEL CORONAVIRUS, NAA: SARS-CoV-2, NAA: NOT DETECTED

## 2019-07-17 ENCOUNTER — Other Ambulatory Visit: Payer: Self-pay

## 2019-07-17 DIAGNOSIS — Z20822 Contact with and (suspected) exposure to covid-19: Secondary | ICD-10-CM

## 2019-07-18 LAB — NOVEL CORONAVIRUS, NAA: SARS-CoV-2, NAA: NOT DETECTED

## 2020-08-07 ENCOUNTER — Other Ambulatory Visit: Payer: Self-pay | Admitting: Obstetrics and Gynecology

## 2020-08-07 DIAGNOSIS — R5381 Other malaise: Secondary | ICD-10-CM

## 2020-09-02 ENCOUNTER — Other Ambulatory Visit: Payer: Self-pay | Admitting: Obstetrics and Gynecology

## 2020-09-02 DIAGNOSIS — M8580 Other specified disorders of bone density and structure, unspecified site: Secondary | ICD-10-CM

## 2020-09-02 DIAGNOSIS — Z780 Asymptomatic menopausal state: Secondary | ICD-10-CM

## 2020-10-31 DIAGNOSIS — Z20822 Contact with and (suspected) exposure to covid-19: Secondary | ICD-10-CM | POA: Diagnosis not present

## 2020-12-07 ENCOUNTER — Other Ambulatory Visit: Payer: Self-pay | Admitting: Obstetrics and Gynecology

## 2020-12-07 DIAGNOSIS — Z780 Asymptomatic menopausal state: Secondary | ICD-10-CM

## 2020-12-07 DIAGNOSIS — M8580 Other specified disorders of bone density and structure, unspecified site: Secondary | ICD-10-CM

## 2020-12-08 ENCOUNTER — Other Ambulatory Visit: Payer: Medicare HMO

## 2020-12-31 ENCOUNTER — Other Ambulatory Visit: Payer: Self-pay

## 2020-12-31 ENCOUNTER — Ambulatory Visit
Admission: RE | Admit: 2020-12-31 | Discharge: 2020-12-31 | Disposition: A | Discharge status: Home / Self Care | Payer: Medicare HMO | Source: Ambulatory Visit | Attending: Obstetrics and Gynecology | Admitting: Obstetrics and Gynecology

## 2020-12-31 DIAGNOSIS — Z79899 Other long term (current) drug therapy: Secondary | ICD-10-CM | POA: Diagnosis not present

## 2020-12-31 DIAGNOSIS — Z23 Encounter for immunization: Secondary | ICD-10-CM | POA: Diagnosis not present

## 2020-12-31 DIAGNOSIS — Z780 Asymptomatic menopausal state: Secondary | ICD-10-CM | POA: Diagnosis not present

## 2020-12-31 DIAGNOSIS — Z0000 Encounter for general adult medical examination without abnormal findings: Secondary | ICD-10-CM | POA: Diagnosis not present

## 2020-12-31 DIAGNOSIS — E7800 Pure hypercholesterolemia, unspecified: Secondary | ICD-10-CM | POA: Diagnosis not present

## 2020-12-31 DIAGNOSIS — M8589 Other specified disorders of bone density and structure, multiple sites: Secondary | ICD-10-CM | POA: Diagnosis not present

## 2020-12-31 DIAGNOSIS — I10 Essential (primary) hypertension: Secondary | ICD-10-CM | POA: Diagnosis not present

## 2020-12-31 DIAGNOSIS — M8580 Other specified disorders of bone density and structure, unspecified site: Secondary | ICD-10-CM

## 2021-01-11 DIAGNOSIS — L299 Pruritus, unspecified: Secondary | ICD-10-CM | POA: Diagnosis not present

## 2021-01-11 DIAGNOSIS — L500 Allergic urticaria: Secondary | ICD-10-CM | POA: Diagnosis not present

## 2021-01-25 DIAGNOSIS — L282 Other prurigo: Secondary | ICD-10-CM | POA: Diagnosis not present

## 2021-02-22 DIAGNOSIS — M8580 Other specified disorders of bone density and structure, unspecified site: Secondary | ICD-10-CM | POA: Diagnosis not present

## 2021-02-25 ENCOUNTER — Other Ambulatory Visit: Payer: Self-pay | Admitting: Geriatric Medicine

## 2021-02-25 DIAGNOSIS — E7800 Pure hypercholesterolemia, unspecified: Secondary | ICD-10-CM

## 2021-03-29 ENCOUNTER — Ambulatory Visit
Admission: RE | Admit: 2021-03-29 | Discharge: 2021-03-29 | Disposition: A | Discharge status: Home / Self Care | Payer: Self-pay | Source: Ambulatory Visit | Attending: Geriatric Medicine | Admitting: Geriatric Medicine

## 2021-03-29 ENCOUNTER — Other Ambulatory Visit: Payer: Self-pay

## 2021-03-29 DIAGNOSIS — E7800 Pure hypercholesterolemia, unspecified: Secondary | ICD-10-CM

## 2021-03-30 DIAGNOSIS — H25812 Combined forms of age-related cataract, left eye: Secondary | ICD-10-CM | POA: Diagnosis not present

## 2021-03-30 DIAGNOSIS — H2511 Age-related nuclear cataract, right eye: Secondary | ICD-10-CM | POA: Diagnosis not present

## 2021-03-30 DIAGNOSIS — H10413 Chronic giant papillary conjunctivitis, bilateral: Secondary | ICD-10-CM | POA: Diagnosis not present

## 2021-03-31 DIAGNOSIS — I798 Other disorders of arteries, arterioles and capillaries in diseases classified elsewhere: Secondary | ICD-10-CM | POA: Diagnosis not present

## 2021-03-31 DIAGNOSIS — I7789 Other specified disorders of arteries and arterioles: Secondary | ICD-10-CM | POA: Diagnosis not present

## 2021-03-31 DIAGNOSIS — I10 Essential (primary) hypertension: Secondary | ICD-10-CM | POA: Diagnosis not present

## 2021-05-25 ENCOUNTER — Ambulatory Visit (INDEPENDENT_AMBULATORY_CARE_PROVIDER_SITE_OTHER): Payer: Medicare HMO

## 2021-05-25 ENCOUNTER — Other Ambulatory Visit: Payer: Self-pay

## 2021-05-25 ENCOUNTER — Ambulatory Visit: Payer: Medicare HMO | Admitting: Podiatry

## 2021-05-25 ENCOUNTER — Encounter: Payer: Self-pay | Admitting: Podiatry

## 2021-05-25 DIAGNOSIS — M76829 Posterior tibial tendinitis, unspecified leg: Secondary | ICD-10-CM

## 2021-05-25 DIAGNOSIS — S99911A Unspecified injury of right ankle, initial encounter: Secondary | ICD-10-CM

## 2021-05-25 DIAGNOSIS — M7751 Other enthesopathy of right foot: Secondary | ICD-10-CM

## 2021-05-25 DIAGNOSIS — M2142 Flat foot [pes planus] (acquired), left foot: Secondary | ICD-10-CM | POA: Diagnosis not present

## 2021-05-25 DIAGNOSIS — M19071 Primary osteoarthritis, right ankle and foot: Secondary | ICD-10-CM | POA: Diagnosis not present

## 2021-05-25 DIAGNOSIS — M2141 Flat foot [pes planus] (acquired), right foot: Secondary | ICD-10-CM | POA: Diagnosis not present

## 2021-05-31 NOTE — Progress Notes (Signed)
Subjective:   Patient ID: Granville Lewis, female   DOB: 70 y.o.   MRN: [id number]   HPI 70 year old female presents the office today for concerns of right ankle pain.  She states that she typically wears shoes with good arch supports that she has a history of multiple surgeries on her feet including flatfoot reconstruction.  She now has arthritis to her foot.  However she was on vacation in [facility] and she was wearing flat water shoes when she started getting discomfort pointing the medial aspect ankle, foot.  No specific injury that she reports.  She does get some swelling to the area.  No redness.  She has pain if she sits and stands back up.   Review of Systems  All other systems reviewed and are negative.  Past Medical History:  Diagnosis Date   Bulging disc    Diverticulitis of colon with perforation 07/21/2014    Past Surgical History:  Procedure Laterality Date   APPENDECTOMY     ELBOW SURGERY     FOOT SURGERY     KNEE SURGERY       Current Outpatient Medications:    acetaminophen (TYLENOL) 500 MG tablet, Take by mouth., Disp: , Rfl:    calcium carbonate (OS-CAL) 600 MG TABS tablet, Take 600 mg by mouth 2 (two) times daily with a meal., Disp: , Rfl:    cholecalciferol (VITAMIN D) 1000 UNITS tablet, Take 1,000 Units by mouth daily., Disp: , Rfl:    lisinopril (ZESTRIL) 5 MG tablet, lisinopril 5 mg tablet  TAKE 1 TABLET BY MOUTH EVERY DAY, Disp: , Rfl:    LOTEMAX 0.5 % OINT, Apply topically., Disp: , Rfl:    naproxen sodium (ALEVE) 220 MG tablet, Take 1 tablet (220 mg total) by mouth 2 (two) times daily as needed., Disp: , Rfl:    pseudoephedrine (SUDAFED) 30 MG tablet, Take by mouth., Disp: , Rfl:    Zoster Vaccine Live, PF, (ZOSTAVAX) 16109 UNT/0.65ML injection, Zostavax (PF) 19,400 unit/0.65 mL subcutaneous suspension  TO BE ADMINISTERED BY PHARMACIST FOR IMMUNIZATION, Disp: , Rfl:   Allergies  Allergen Reactions   Betadine [Povidone Iodine] Itching   Codeine  Itching          Objective:  Physical Exam  General: AAO x3, NAD  Dermatological: Skin is warm, dry and supple bilateral. There are no open sores, no preulcerative lesions, no rash or signs of infection present.  Vascular: Dorsalis Pedis artery and Posterior Tibial artery pedal pulses are 2/4 bilateral with immedate capillary fill time.  There is no pain with calf compression, swelling, warmth, erythema.   Neruologic: Grossly intact via light touch bilateral.  Musculoskeletal: Decreased medial arch upon weightbearing.  The majority of tenderness, edema is localized posterior and inferior to the medial malleolus on the course of the posterior tibial/flexor tendons.  Mild discomfort at the area.  No area of pinpoint tenderness.  Muscular strength 5/5 in all groups tested bilateral.  Gait: Unassisted, Nonantalgic.       Assessment:   Right posterior tibial tendon dysfunction/tendinitis     Plan:  -Treatment options discussed including all alternatives, risks, and complications -Etiology of symptoms were discussed -X-rays were obtained and reviewed with the patient.  Arthritic changes present to the navicular cuneiform joint.  Flatfoot is present.  Evidence of acute fracture. -Given the swelling as well as pain recommended immobilization Tri-Lock ankle brace for now but she decided not to get this today.  Ultimately discussed with her shoes and  good arch supports.  She has been feeling better since going back to wearing the shoes.  Discussed long-term need to continue the shoes and arch support to help prevent reoccurrence given the arthritis, flatfoot.  Consider surgical intervention in the future but she had multiple surgeries already.  If symptoms continue MRI.  Trula Slade DPM

## 2021-06-09 DIAGNOSIS — L821 Other seborrheic keratosis: Secondary | ICD-10-CM | POA: Diagnosis not present

## 2021-06-09 DIAGNOSIS — D485 Neoplasm of uncertain behavior of skin: Secondary | ICD-10-CM | POA: Diagnosis not present

## 2021-06-09 DIAGNOSIS — D2261 Melanocytic nevi of right upper limb, including shoulder: Secondary | ICD-10-CM | POA: Diagnosis not present

## 2021-06-09 DIAGNOSIS — C44519 Basal cell carcinoma of skin of other part of trunk: Secondary | ICD-10-CM | POA: Diagnosis not present

## 2021-06-09 DIAGNOSIS — D2272 Melanocytic nevi of left lower limb, including hip: Secondary | ICD-10-CM | POA: Diagnosis not present

## 2021-06-09 DIAGNOSIS — D225 Melanocytic nevi of trunk: Secondary | ICD-10-CM | POA: Diagnosis not present

## 2021-06-09 DIAGNOSIS — L905 Scar conditions and fibrosis of skin: Secondary | ICD-10-CM | POA: Diagnosis not present

## 2021-06-17 DIAGNOSIS — I10 Essential (primary) hypertension: Secondary | ICD-10-CM | POA: Diagnosis not present

## 2021-07-13 DIAGNOSIS — Z79899 Other long term (current) drug therapy: Secondary | ICD-10-CM | POA: Diagnosis not present

## 2021-07-30 DIAGNOSIS — Z01419 Encounter for gynecological examination (general) (routine) without abnormal findings: Secondary | ICD-10-CM | POA: Diagnosis not present

## 2021-07-30 DIAGNOSIS — Z124 Encounter for screening for malignant neoplasm of cervix: Secondary | ICD-10-CM | POA: Diagnosis not present

## 2021-08-16 DIAGNOSIS — M1990 Unspecified osteoarthritis, unspecified site: Secondary | ICD-10-CM | POA: Diagnosis not present

## 2021-08-16 DIAGNOSIS — G8929 Other chronic pain: Secondary | ICD-10-CM | POA: Diagnosis not present

## 2021-08-16 DIAGNOSIS — Z885 Allergy status to narcotic agent status: Secondary | ICD-10-CM | POA: Diagnosis not present

## 2021-08-16 DIAGNOSIS — Z7983 Long term (current) use of bisphosphonates: Secondary | ICD-10-CM | POA: Diagnosis not present

## 2021-08-16 DIAGNOSIS — Z809 Family history of malignant neoplasm, unspecified: Secondary | ICD-10-CM | POA: Diagnosis not present

## 2021-08-16 DIAGNOSIS — Z7722 Contact with and (suspected) exposure to environmental tobacco smoke (acute) (chronic): Secondary | ICD-10-CM | POA: Diagnosis not present

## 2021-08-16 DIAGNOSIS — M8580 Other specified disorders of bone density and structure, unspecified site: Secondary | ICD-10-CM | POA: Diagnosis not present

## 2021-08-16 DIAGNOSIS — I10 Essential (primary) hypertension: Secondary | ICD-10-CM | POA: Diagnosis not present

## 2021-08-16 DIAGNOSIS — Z87892 Personal history of anaphylaxis: Secondary | ICD-10-CM | POA: Diagnosis not present

## 2021-08-31 DIAGNOSIS — M19021 Primary osteoarthritis, right elbow: Secondary | ICD-10-CM | POA: Diagnosis not present

## 2021-08-31 DIAGNOSIS — M25321 Other instability, right elbow: Secondary | ICD-10-CM | POA: Diagnosis not present

## 2021-09-10 DIAGNOSIS — Z1231 Encounter for screening mammogram for malignant neoplasm of breast: Secondary | ICD-10-CM | POA: Diagnosis not present

## 2021-09-13 DIAGNOSIS — M19021 Primary osteoarthritis, right elbow: Secondary | ICD-10-CM | POA: Diagnosis not present

## 2021-09-13 DIAGNOSIS — M25321 Other instability, right elbow: Secondary | ICD-10-CM | POA: Diagnosis not present

## 2021-09-13 DIAGNOSIS — M25521 Pain in right elbow: Secondary | ICD-10-CM | POA: Diagnosis not present

## 2021-09-13 DIAGNOSIS — M25821 Other specified joint disorders, right elbow: Secondary | ICD-10-CM | POA: Diagnosis not present

## 2021-09-14 ENCOUNTER — Other Ambulatory Visit: Payer: Self-pay | Admitting: Obstetrics and Gynecology

## 2021-09-14 DIAGNOSIS — R928 Other abnormal and inconclusive findings on diagnostic imaging of breast: Secondary | ICD-10-CM

## 2021-09-17 ENCOUNTER — Ambulatory Visit: Payer: No Typology Code available for payment source

## 2021-09-17 ENCOUNTER — Ambulatory Visit
Admission: RE | Admit: 2021-09-17 | Discharge: 2021-09-17 | Disposition: A | Discharge status: Home / Self Care | Payer: Medicare HMO | Source: Ambulatory Visit | Attending: Obstetrics and Gynecology | Admitting: Obstetrics and Gynecology

## 2021-09-17 DIAGNOSIS — R928 Other abnormal and inconclusive findings on diagnostic imaging of breast: Secondary | ICD-10-CM

## 2021-09-17 DIAGNOSIS — R922 Inconclusive mammogram: Secondary | ICD-10-CM | POA: Diagnosis not present

## 2021-09-20 DIAGNOSIS — M25321 Other instability, right elbow: Secondary | ICD-10-CM | POA: Diagnosis not present

## 2021-09-20 DIAGNOSIS — M25521 Pain in right elbow: Secondary | ICD-10-CM | POA: Diagnosis not present

## 2022-01-05 ENCOUNTER — Other Ambulatory Visit: Payer: Self-pay | Admitting: Obstetrics and Gynecology

## 2022-01-05 DIAGNOSIS — M810 Age-related osteoporosis without current pathological fracture: Secondary | ICD-10-CM

## 2022-01-12 ENCOUNTER — Other Ambulatory Visit: Payer: Self-pay | Admitting: Geriatric Medicine

## 2022-01-12 DIAGNOSIS — E7800 Pure hypercholesterolemia, unspecified: Secondary | ICD-10-CM | POA: Diagnosis not present

## 2022-01-12 DIAGNOSIS — I10 Essential (primary) hypertension: Secondary | ICD-10-CM | POA: Diagnosis not present

## 2022-01-12 DIAGNOSIS — J301 Allergic rhinitis due to pollen: Secondary | ICD-10-CM | POA: Diagnosis not present

## 2022-01-12 DIAGNOSIS — I7789 Other specified disorders of arteries and arterioles: Secondary | ICD-10-CM | POA: Diagnosis not present

## 2022-01-12 DIAGNOSIS — Z79899 Other long term (current) drug therapy: Secondary | ICD-10-CM | POA: Diagnosis not present

## 2022-01-12 DIAGNOSIS — Z0000 Encounter for general adult medical examination without abnormal findings: Secondary | ICD-10-CM | POA: Diagnosis not present

## 2022-01-12 DIAGNOSIS — M8588 Other specified disorders of bone density and structure, other site: Secondary | ICD-10-CM | POA: Diagnosis not present

## 2022-01-12 DIAGNOSIS — M8589 Other specified disorders of bone density and structure, multiple sites: Secondary | ICD-10-CM | POA: Diagnosis not present

## 2022-01-13 DIAGNOSIS — Z79899 Other long term (current) drug therapy: Secondary | ICD-10-CM | POA: Diagnosis not present

## 2022-02-21 ENCOUNTER — Ambulatory Visit
Admission: RE | Admit: 2022-02-21 | Discharge: 2022-02-21 | Disposition: A | Discharge status: Home / Self Care | Payer: Medicare HMO | Source: Ambulatory Visit | Attending: Geriatric Medicine | Admitting: Geriatric Medicine

## 2022-02-21 DIAGNOSIS — J984 Other disorders of lung: Secondary | ICD-10-CM | POA: Diagnosis not present

## 2022-02-21 DIAGNOSIS — I7789 Other specified disorders of arteries and arterioles: Secondary | ICD-10-CM

## 2022-02-21 DIAGNOSIS — I712 Thoracic aortic aneurysm, without rupture, unspecified: Secondary | ICD-10-CM | POA: Diagnosis not present

## 2022-02-21 MED ORDER — IOPAMIDOL (ISOVUE-370) INJECTION 76%
75.0000 mL | Freq: Once | INTRAVENOUS | Status: AC | PRN
  Administered 2022-02-21: 75 mL via INTRAVENOUS

## 2022-02-22 ENCOUNTER — Other Ambulatory Visit (HOSPITAL_COMMUNITY): Payer: Self-pay | Admitting: Obstetrics and Gynecology

## 2022-02-22 DIAGNOSIS — M810 Age-related osteoporosis without current pathological fracture: Secondary | ICD-10-CM

## 2022-02-24 ENCOUNTER — Ambulatory Visit (HOSPITAL_BASED_OUTPATIENT_CLINIC_OR_DEPARTMENT_OTHER)
Admission: RE | Admit: 2022-02-24 | Discharge: 2022-02-24 | Disposition: A | Discharge status: Home / Self Care | Payer: Medicare HMO | Source: Ambulatory Visit | Attending: Obstetrics and Gynecology | Admitting: Obstetrics and Gynecology

## 2022-02-24 DIAGNOSIS — M25562 Pain in left knee: Secondary | ICD-10-CM | POA: Diagnosis not present

## 2022-02-24 DIAGNOSIS — M810 Age-related osteoporosis without current pathological fracture: Secondary | ICD-10-CM

## 2022-02-24 DIAGNOSIS — M25561 Pain in right knee: Secondary | ICD-10-CM | POA: Diagnosis not present

## 2022-03-08 ENCOUNTER — Ambulatory Visit (HOSPITAL_BASED_OUTPATIENT_CLINIC_OR_DEPARTMENT_OTHER)
Admission: RE | Admit: 2022-03-08 | Discharge: 2022-03-08 | Disposition: A | Discharge status: Home / Self Care | Payer: Medicare HMO | Source: Ambulatory Visit | Attending: Obstetrics and Gynecology | Admitting: Obstetrics and Gynecology

## 2022-03-08 DIAGNOSIS — M810 Age-related osteoporosis without current pathological fracture: Secondary | ICD-10-CM | POA: Insufficient documentation

## 2022-03-08 DIAGNOSIS — M85852 Other specified disorders of bone density and structure, left thigh: Secondary | ICD-10-CM | POA: Diagnosis not present

## 2022-03-08 DIAGNOSIS — Z780 Asymptomatic menopausal state: Secondary | ICD-10-CM | POA: Diagnosis not present

## 2022-03-09 DIAGNOSIS — D485 Neoplasm of uncertain behavior of skin: Secondary | ICD-10-CM | POA: Diagnosis not present

## 2022-03-09 DIAGNOSIS — B079 Viral wart, unspecified: Secondary | ICD-10-CM | POA: Diagnosis not present

## 2022-03-10 DIAGNOSIS — K648 Other hemorrhoids: Secondary | ICD-10-CM | POA: Diagnosis not present

## 2022-03-10 DIAGNOSIS — Z86010 Personal history of colonic polyps: Secondary | ICD-10-CM | POA: Diagnosis not present

## 2022-03-10 DIAGNOSIS — Z09 Encounter for follow-up examination after completed treatment for conditions other than malignant neoplasm: Secondary | ICD-10-CM | POA: Diagnosis not present

## 2022-03-10 DIAGNOSIS — D123 Benign neoplasm of transverse colon: Secondary | ICD-10-CM | POA: Diagnosis not present

## 2022-03-15 DIAGNOSIS — D123 Benign neoplasm of transverse colon: Secondary | ICD-10-CM | POA: Diagnosis not present

## 2022-06-15 DIAGNOSIS — D692 Other nonthrombocytopenic purpura: Secondary | ICD-10-CM | POA: Diagnosis not present

## 2022-06-15 DIAGNOSIS — L720 Epidermal cyst: Secondary | ICD-10-CM | POA: Diagnosis not present

## 2022-06-15 DIAGNOSIS — D225 Melanocytic nevi of trunk: Secondary | ICD-10-CM | POA: Diagnosis not present

## 2022-06-15 DIAGNOSIS — L280 Lichen simplex chronicus: Secondary | ICD-10-CM | POA: Diagnosis not present

## 2022-06-15 DIAGNOSIS — Z85828 Personal history of other malignant neoplasm of skin: Secondary | ICD-10-CM | POA: Diagnosis not present

## 2022-06-15 DIAGNOSIS — L821 Other seborrheic keratosis: Secondary | ICD-10-CM | POA: Diagnosis not present

## 2022-07-11 DIAGNOSIS — M205X1 Other deformities of toe(s) (acquired), right foot: Secondary | ICD-10-CM | POA: Diagnosis not present

## 2022-07-11 DIAGNOSIS — M19071 Primary osteoarthritis, right ankle and foot: Secondary | ICD-10-CM | POA: Diagnosis not present

## 2022-07-11 DIAGNOSIS — M25571 Pain in right ankle and joints of right foot: Secondary | ICD-10-CM | POA: Diagnosis not present

## 2022-07-11 DIAGNOSIS — M79671 Pain in right foot: Secondary | ICD-10-CM | POA: Diagnosis not present

## 2022-07-12 ENCOUNTER — Other Ambulatory Visit: Payer: Medicare HMO

## 2022-07-13 DIAGNOSIS — M6281 Muscle weakness (generalized): Secondary | ICD-10-CM | POA: Diagnosis not present

## 2022-07-13 DIAGNOSIS — M19071 Primary osteoarthritis, right ankle and foot: Secondary | ICD-10-CM | POA: Diagnosis not present

## 2022-07-15 DIAGNOSIS — M6281 Muscle weakness (generalized): Secondary | ICD-10-CM | POA: Diagnosis not present

## 2022-07-15 DIAGNOSIS — M19071 Primary osteoarthritis, right ankle and foot: Secondary | ICD-10-CM | POA: Diagnosis not present

## 2022-07-18 DIAGNOSIS — M6281 Muscle weakness (generalized): Secondary | ICD-10-CM | POA: Diagnosis not present

## 2022-07-18 DIAGNOSIS — M19071 Primary osteoarthritis, right ankle and foot: Secondary | ICD-10-CM | POA: Diagnosis not present

## 2022-07-19 DIAGNOSIS — M2041 Other hammer toe(s) (acquired), right foot: Secondary | ICD-10-CM | POA: Diagnosis not present

## 2022-07-19 DIAGNOSIS — M205X1 Other deformities of toe(s) (acquired), right foot: Secondary | ICD-10-CM | POA: Diagnosis not present

## 2022-08-08 DIAGNOSIS — M19071 Primary osteoarthritis, right ankle and foot: Secondary | ICD-10-CM | POA: Diagnosis not present

## 2022-08-08 DIAGNOSIS — M6281 Muscle weakness (generalized): Secondary | ICD-10-CM | POA: Diagnosis not present

## 2022-08-12 DIAGNOSIS — M19071 Primary osteoarthritis, right ankle and foot: Secondary | ICD-10-CM | POA: Diagnosis not present

## 2022-08-12 DIAGNOSIS — M6281 Muscle weakness (generalized): Secondary | ICD-10-CM | POA: Diagnosis not present

## 2022-08-29 DIAGNOSIS — M19071 Primary osteoarthritis, right ankle and foot: Secondary | ICD-10-CM | POA: Diagnosis not present

## 2022-08-29 DIAGNOSIS — M6281 Muscle weakness (generalized): Secondary | ICD-10-CM | POA: Diagnosis not present

## 2022-09-01 DIAGNOSIS — M19071 Primary osteoarthritis, right ankle and foot: Secondary | ICD-10-CM | POA: Diagnosis not present

## 2022-09-01 DIAGNOSIS — M6281 Muscle weakness (generalized): Secondary | ICD-10-CM | POA: Diagnosis not present

## 2022-09-06 DIAGNOSIS — M6281 Muscle weakness (generalized): Secondary | ICD-10-CM | POA: Diagnosis not present

## 2022-09-06 DIAGNOSIS — M19071 Primary osteoarthritis, right ankle and foot: Secondary | ICD-10-CM | POA: Diagnosis not present

## 2022-11-14 DIAGNOSIS — R69 Illness, unspecified: Secondary | ICD-10-CM | POA: Diagnosis not present

## 2022-12-06 DIAGNOSIS — H5201 Hypermetropia, right eye: Secondary | ICD-10-CM | POA: Diagnosis not present

## 2022-12-06 DIAGNOSIS — H5212 Myopia, left eye: Secondary | ICD-10-CM | POA: Diagnosis not present

## 2022-12-06 DIAGNOSIS — H52223 Regular astigmatism, bilateral: Secondary | ICD-10-CM | POA: Diagnosis not present

## 2022-12-07 DIAGNOSIS — R69 Illness, unspecified: Secondary | ICD-10-CM | POA: Diagnosis not present

## 2022-12-27 DIAGNOSIS — M720 Palmar fascial fibromatosis [Dupuytren]: Secondary | ICD-10-CM | POA: Diagnosis not present

## 2022-12-27 DIAGNOSIS — M79642 Pain in left hand: Secondary | ICD-10-CM | POA: Diagnosis not present

## 2023-02-14 DIAGNOSIS — R69 Illness, unspecified: Secondary | ICD-10-CM | POA: Diagnosis not present

## 2023-03-17 DIAGNOSIS — R69 Illness, unspecified: Secondary | ICD-10-CM | POA: Diagnosis not present

## 2023-03-23 DIAGNOSIS — L438 Other lichen planus: Secondary | ICD-10-CM | POA: Diagnosis not present

## 2023-03-23 DIAGNOSIS — L821 Other seborrheic keratosis: Secondary | ICD-10-CM | POA: Diagnosis not present

## 2023-04-05 DIAGNOSIS — Z79899 Other long term (current) drug therapy: Secondary | ICD-10-CM | POA: Diagnosis not present

## 2023-04-05 DIAGNOSIS — M8589 Other specified disorders of bone density and structure, multiple sites: Secondary | ICD-10-CM | POA: Diagnosis not present

## 2023-04-05 DIAGNOSIS — I10 Essential (primary) hypertension: Secondary | ICD-10-CM | POA: Diagnosis not present

## 2023-04-05 DIAGNOSIS — E7800 Pure hypercholesterolemia, unspecified: Secondary | ICD-10-CM | POA: Diagnosis not present

## 2023-04-05 DIAGNOSIS — J301 Allergic rhinitis due to pollen: Secondary | ICD-10-CM | POA: Diagnosis not present

## 2023-04-05 DIAGNOSIS — Z1231 Encounter for screening mammogram for malignant neoplasm of breast: Secondary | ICD-10-CM | POA: Diagnosis not present

## 2023-04-05 DIAGNOSIS — Z1331 Encounter for screening for depression: Secondary | ICD-10-CM | POA: Diagnosis not present

## 2023-04-05 DIAGNOSIS — I7121 Aneurysm of the ascending aorta, without rupture: Secondary | ICD-10-CM | POA: Diagnosis not present

## 2023-04-05 DIAGNOSIS — Z0000 Encounter for general adult medical examination without abnormal findings: Secondary | ICD-10-CM | POA: Diagnosis not present

## 2023-04-05 DIAGNOSIS — Z89421 Acquired absence of other right toe(s): Secondary | ICD-10-CM | POA: Diagnosis not present

## 2023-04-05 DIAGNOSIS — R42 Dizziness and giddiness: Secondary | ICD-10-CM | POA: Diagnosis not present

## 2023-04-05 DIAGNOSIS — Z1211 Encounter for screening for malignant neoplasm of colon: Secondary | ICD-10-CM | POA: Diagnosis not present

## 2023-04-12 ENCOUNTER — Other Ambulatory Visit: Payer: Self-pay | Admitting: Internal Medicine

## 2023-04-12 DIAGNOSIS — I7121 Aneurysm of the ascending aorta, without rupture: Secondary | ICD-10-CM

## 2023-04-17 DIAGNOSIS — H00025 Hordeolum internum left lower eyelid: Secondary | ICD-10-CM | POA: Diagnosis not present

## 2023-04-17 DIAGNOSIS — H00022 Hordeolum internum right lower eyelid: Secondary | ICD-10-CM | POA: Diagnosis not present

## 2023-04-19 ENCOUNTER — Ambulatory Visit
Admission: RE | Admit: 2023-04-19 | Discharge: 2023-04-19 | Disposition: A | Discharge status: Home / Self Care | Payer: Medicare HMO | Source: Ambulatory Visit | Attending: Internal Medicine | Admitting: Internal Medicine

## 2023-04-19 DIAGNOSIS — I10 Essential (primary) hypertension: Secondary | ICD-10-CM | POA: Diagnosis not present

## 2023-04-19 DIAGNOSIS — I7121 Aneurysm of the ascending aorta, without rupture: Secondary | ICD-10-CM | POA: Diagnosis not present

## 2023-04-19 MED ORDER — IOPAMIDOL (ISOVUE-370) INJECTION 76%
75.0000 mL | Freq: Once | INTRAVENOUS | Status: AC | PRN
  Administered 2023-04-19: 75 mL via INTRAVENOUS

## 2023-05-03 IMAGING — CT CT CARDIAC CORONARY ARTERY CALCIUM SCORE
3 series · 14 of 20 positions shown, 16 images · non-contrast
Comparison: None.

CLINICAL DATA: 70-year-old Caucasian female with history of
hypercholesterolemia and family history of heart disease.

EXAM:
CT CARDIAC CORONARY ARTERY CALCIUM SCORE
TECHNIQUE: Non-contrast imaging through the heart was performed using
prospective ECG gating. Image post processing was performed on an
independent workstation, allowing for quantitative analysis of the
heart and coronary arteries. Note that this exam targets the heart
and the chest was not imaged in its entirety.

[Series 2: calcium scoring 2.00 qr36 bestdiast 70% hrt calciu · axial · 0.32mm/px · z∈[+1724,+1796]mm · 4 of 60 slices shown]
[im 12/60  vessel]
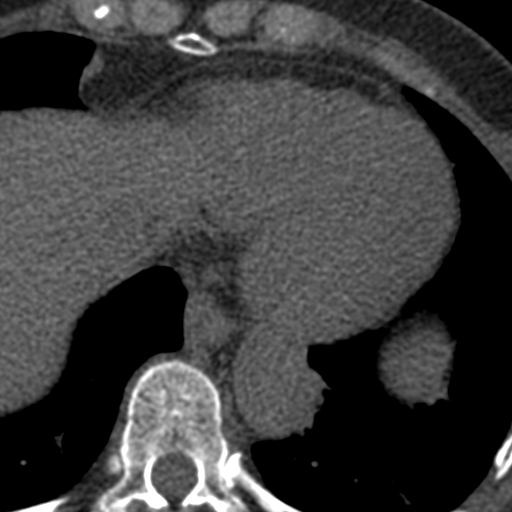
[im 24/60  vessel]
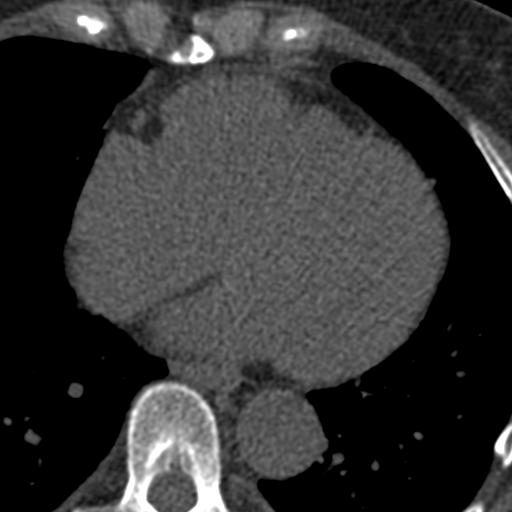
[im 36/60  vessel]
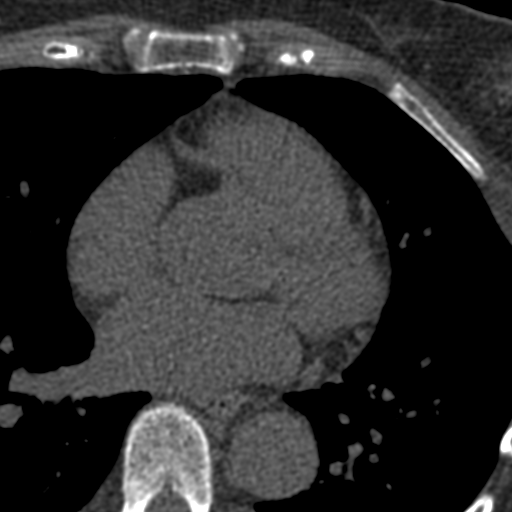
[im 48/60  vessel]
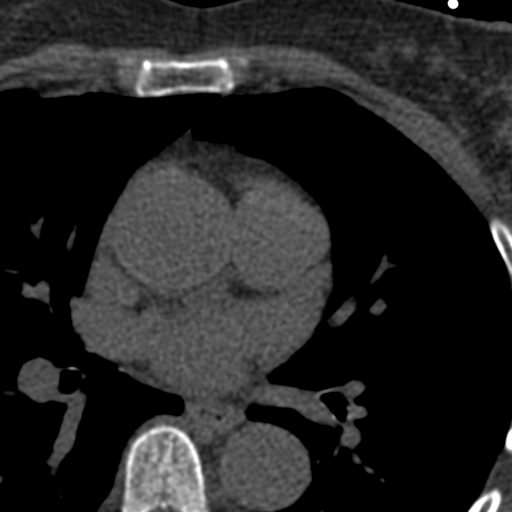

[Series 3: calcium scoring 2.00 br40 bestdiast 70% axial · axial · 0.57mm/px · z∈[+1720,+1800]mm · 5 of 60 slices shown, 7 images]
[im 10/60  vessel]
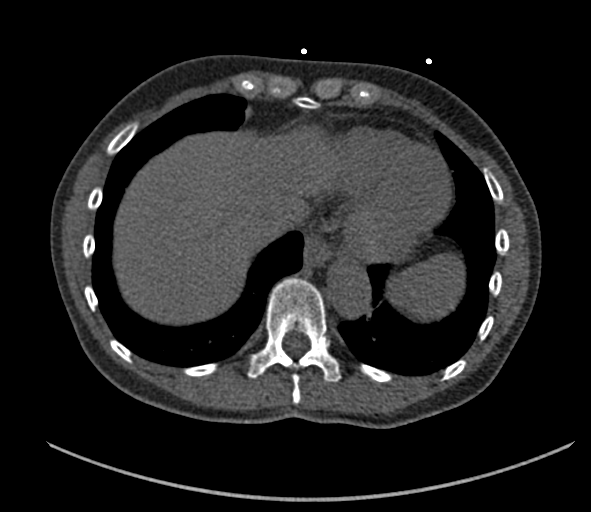
[im 10/60  lung]
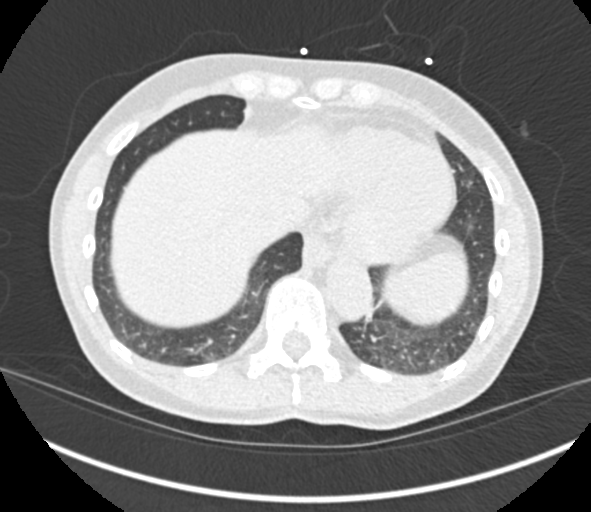
[im 20/60  vessel]
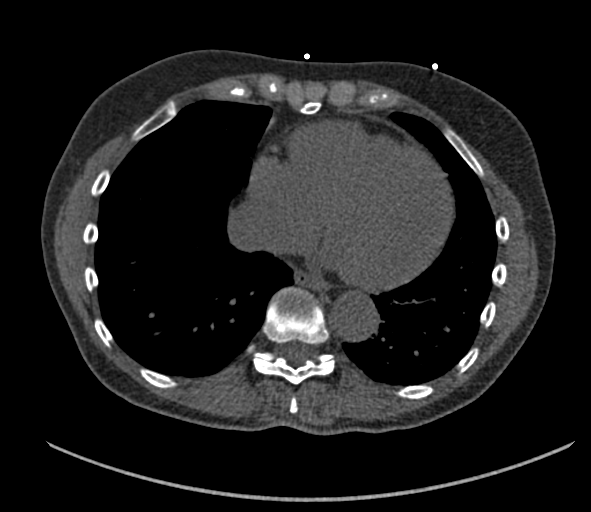
[im 30/60  vessel]
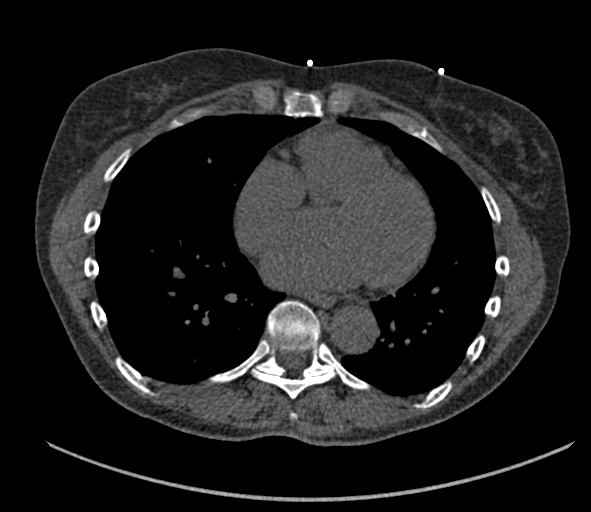
[im 40/60  vessel]
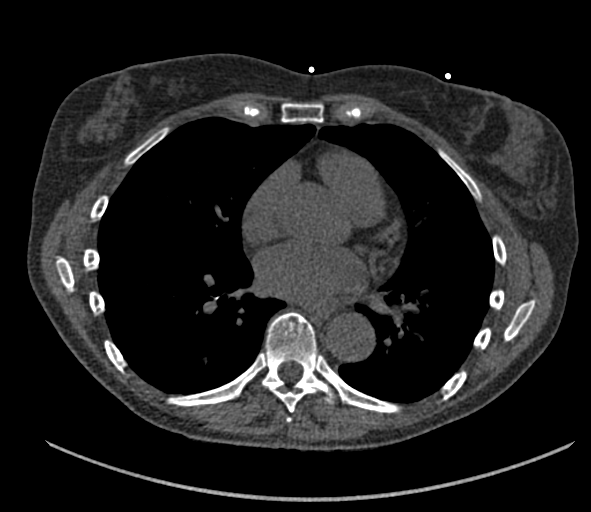
[im 50/60  vessel]
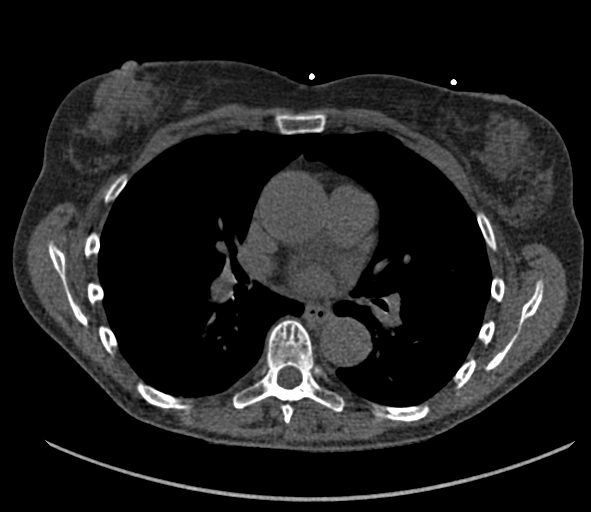
[im 50/60  lung]
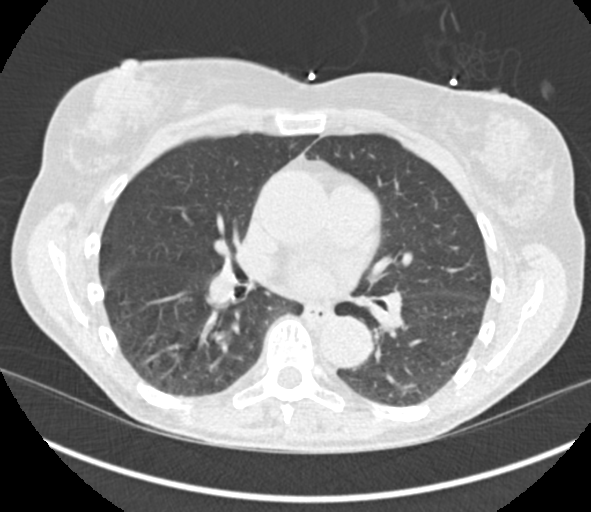

[Series 9: calcium scoring 2.00 br60 bestdiast 70% lungs · axial · 0.57mm/px · z∈[+1720,+1800]mm · 5 of 60 slices shown]
[im 10/60  vessel]
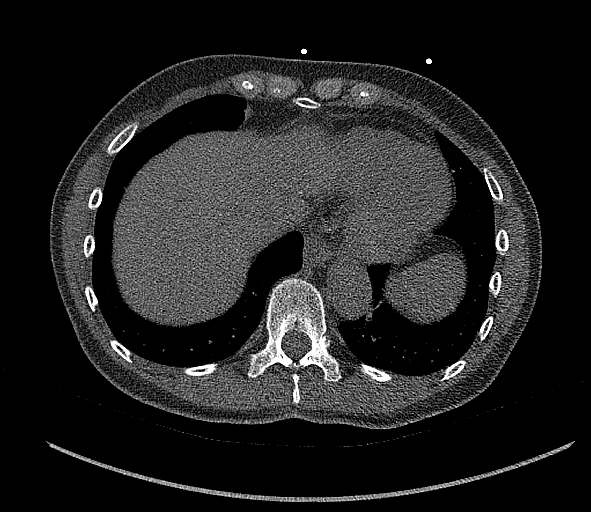
[im 20/60  vessel]
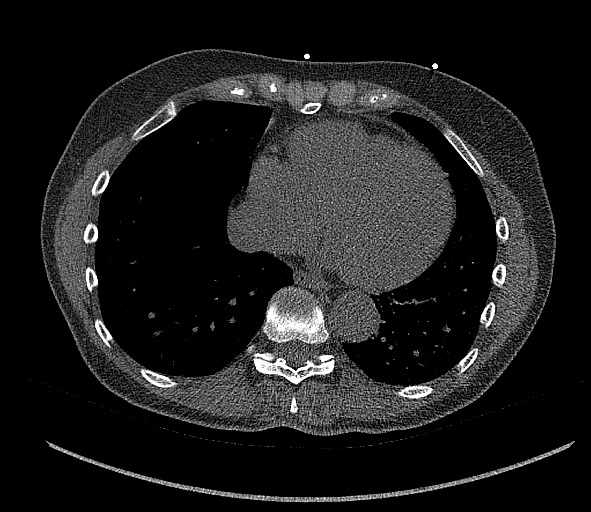
[im 30/60  vessel]
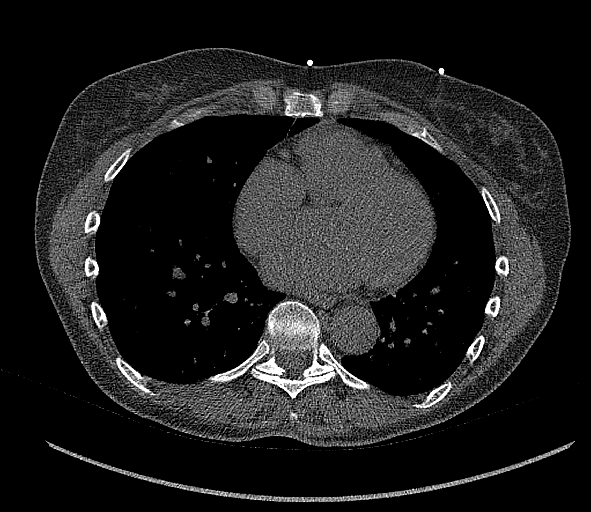
[im 40/60  vessel]
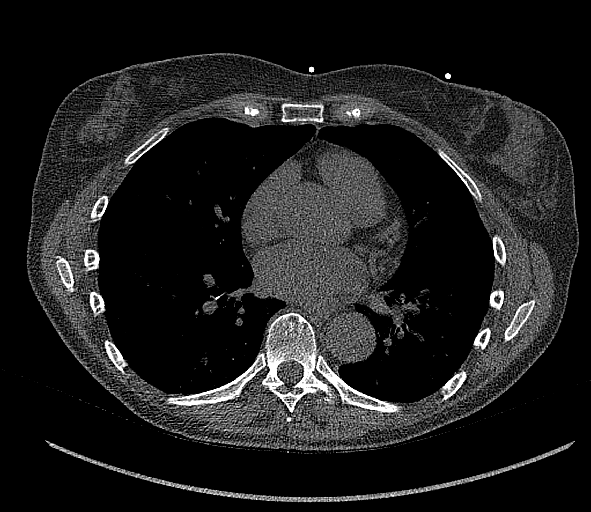
[im 50/60  vessel]
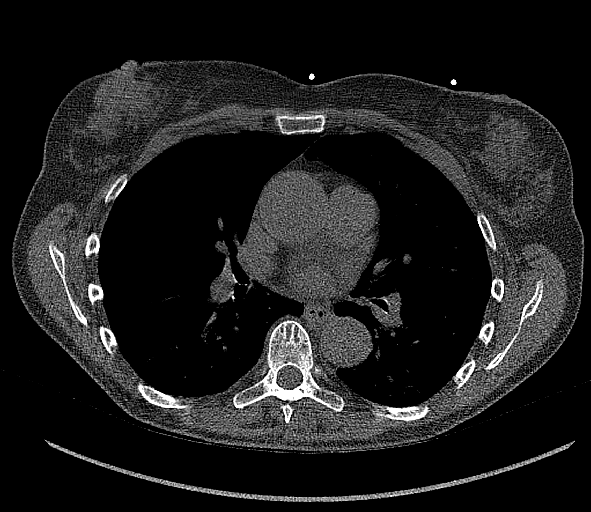

[14 of 20 positions shown; findings below may reference images not displayed]

FINDINGS: CORONARY CALCIUM SCORES:

Left Main: 0

LAD: 0

LCx: 0

RCA: 0

Total Agatston Score: 0

[HOSPITAL] percentile: 0

AORTA MEASUREMENTS:

Ascending Aorta: 40 mm

Descending Aorta: 28 mm

OTHER FINDINGS:

The heart size is within normal limits. No pericardial fluid
identified. The ascending thoracic aorta is mildly dilated and
measures 4 cm in greatest diameter. No visualized calcified plaque
in the visualized segments of the thoracic aorta. Visualized
mediastinum and hilar regions demonstrate no lymphadenopathy or
masses. Visualized lungs show no evidence of pulmonary edema,
consolidation, pneumothorax, nodule or pleural fluid. Visualized
upper abdomen and bony structures are unremarkable.
IMPRESSION: 1. Coronary calcium score of 0.
2. Mild dilatation of the ascending thoracic aorta to 4 cm.
Correlation with echocardiography may be of benefit in evaluating
the aortic valve. Recommend annual imaging followup by CTA or MRA.
This recommendation follows 2909
ACCF/AHA/AATS/ACR/ASA/SCA/RTOYOTA/ALBERS/HAROLD/KIANI Guidelines for the
Diagnosis and Management of Patients with Thoracic Aortic Disease.
Circulation. 2909; 121: [id number]. Aortic aneurysm NOS ([id number])

## 2023-06-08 DIAGNOSIS — R69 Illness, unspecified: Secondary | ICD-10-CM | POA: Diagnosis not present

## 2023-06-19 DIAGNOSIS — D225 Melanocytic nevi of trunk: Secondary | ICD-10-CM | POA: Diagnosis not present

## 2023-06-19 DIAGNOSIS — D485 Neoplasm of uncertain behavior of skin: Secondary | ICD-10-CM | POA: Diagnosis not present

## 2023-06-19 DIAGNOSIS — M19071 Primary osteoarthritis, right ankle and foot: Secondary | ICD-10-CM | POA: Diagnosis not present

## 2023-06-19 DIAGNOSIS — Z85828 Personal history of other malignant neoplasm of skin: Secondary | ICD-10-CM | POA: Diagnosis not present

## 2023-06-19 DIAGNOSIS — L821 Other seborrheic keratosis: Secondary | ICD-10-CM | POA: Diagnosis not present

## 2023-06-19 DIAGNOSIS — L738 Other specified follicular disorders: Secondary | ICD-10-CM | POA: Diagnosis not present

## 2023-07-12 DIAGNOSIS — R69 Illness, unspecified: Secondary | ICD-10-CM | POA: Diagnosis not present

## 2023-08-02 DIAGNOSIS — M79671 Pain in right foot: Secondary | ICD-10-CM | POA: Diagnosis not present

## 2023-08-02 DIAGNOSIS — M25571 Pain in right ankle and joints of right foot: Secondary | ICD-10-CM | POA: Diagnosis not present

## 2023-08-02 DIAGNOSIS — M19071 Primary osteoarthritis, right ankle and foot: Secondary | ICD-10-CM | POA: Diagnosis not present

## 2023-08-02 DIAGNOSIS — M21071 Valgus deformity, not elsewhere classified, right ankle: Secondary | ICD-10-CM | POA: Diagnosis not present

## 2023-08-02 DIAGNOSIS — M6701 Short Achilles tendon (acquired), right ankle: Secondary | ICD-10-CM | POA: Diagnosis not present

## 2023-08-02 DIAGNOSIS — M7731 Calcaneal spur, right foot: Secondary | ICD-10-CM | POA: Diagnosis not present

## 2023-08-02 DIAGNOSIS — M2141 Flat foot [pes planus] (acquired), right foot: Secondary | ICD-10-CM | POA: Diagnosis not present

## 2023-08-04 DIAGNOSIS — M19071 Primary osteoarthritis, right ankle and foot: Secondary | ICD-10-CM | POA: Diagnosis not present

## 2023-08-12 DIAGNOSIS — R69 Illness, unspecified: Secondary | ICD-10-CM | POA: Diagnosis not present

## 2023-09-13 DIAGNOSIS — Z85828 Personal history of other malignant neoplasm of skin: Secondary | ICD-10-CM | POA: Diagnosis not present

## 2023-09-13 DIAGNOSIS — L570 Actinic keratosis: Secondary | ICD-10-CM | POA: Diagnosis not present

## 2023-09-13 DIAGNOSIS — L603 Nail dystrophy: Secondary | ICD-10-CM | POA: Diagnosis not present

## 2023-09-13 DIAGNOSIS — D485 Neoplasm of uncertain behavior of skin: Secondary | ICD-10-CM | POA: Diagnosis not present

## 2023-09-18 DIAGNOSIS — Z01419 Encounter for gynecological examination (general) (routine) without abnormal findings: Secondary | ICD-10-CM | POA: Diagnosis not present

## 2023-09-18 DIAGNOSIS — Z1231 Encounter for screening mammogram for malignant neoplasm of breast: Secondary | ICD-10-CM | POA: Diagnosis not present

## 2023-10-22 IMAGING — MG MM DIGITAL DIAGNOSTIC UNILAT*L* W/ TOMO W/ CAD
4 series · 4 of 12 positions shown · non-contrast
Comparison: Previous exam(s).

CLINICAL DATA: Recall from screening mammography, possible
asymmetry in the retroareolar LEFT breast at posterior depth,
visible only on the MLO view, possibly associated with subtle
distortion.

EXAM:
DIGITAL DIAGNOSTIC UNILATERAL LEFT MAMMOGRAM WITH TOMOSYNTHESIS AND
CAD
TECHNIQUE: Left digital diagnostic mammography and breast tomosynthesis was
performed. The images were evaluated with computer-aided detection.

[L ML synth-2D]
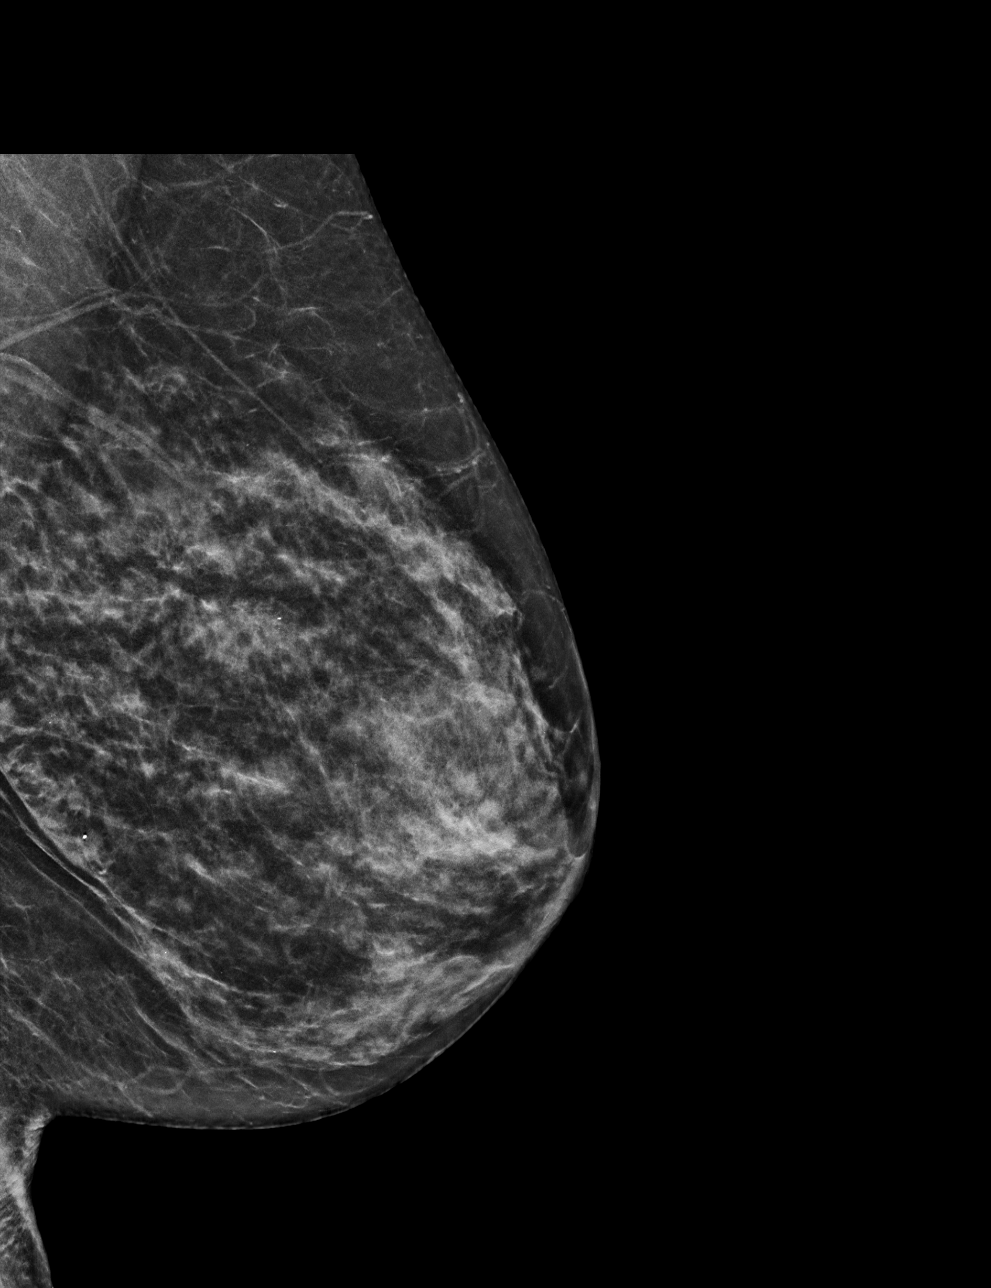

[L MLO synth-2D]
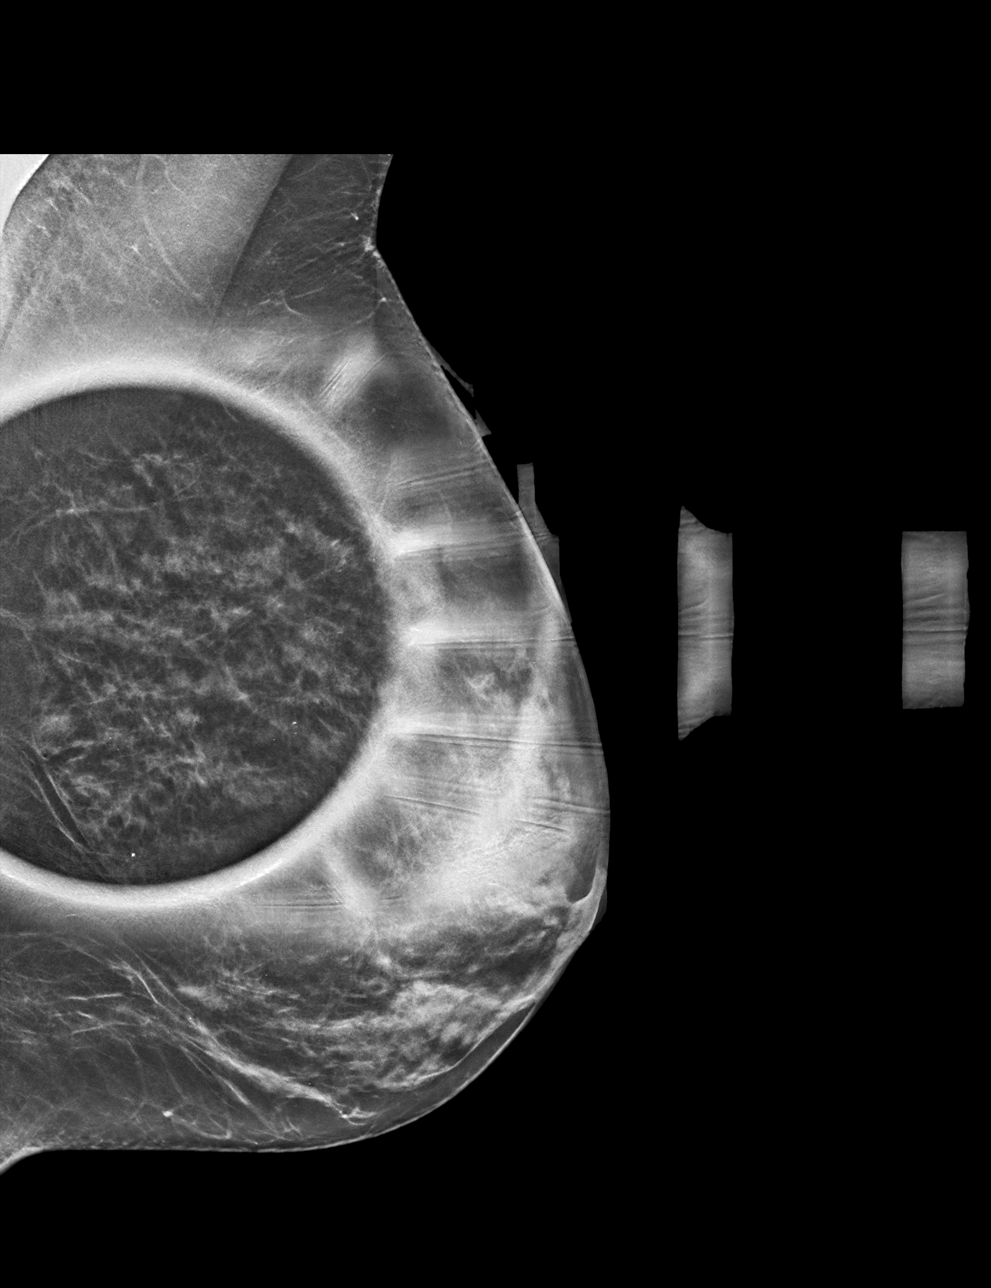

[L ML tomo · tomo slice 25/49.0]
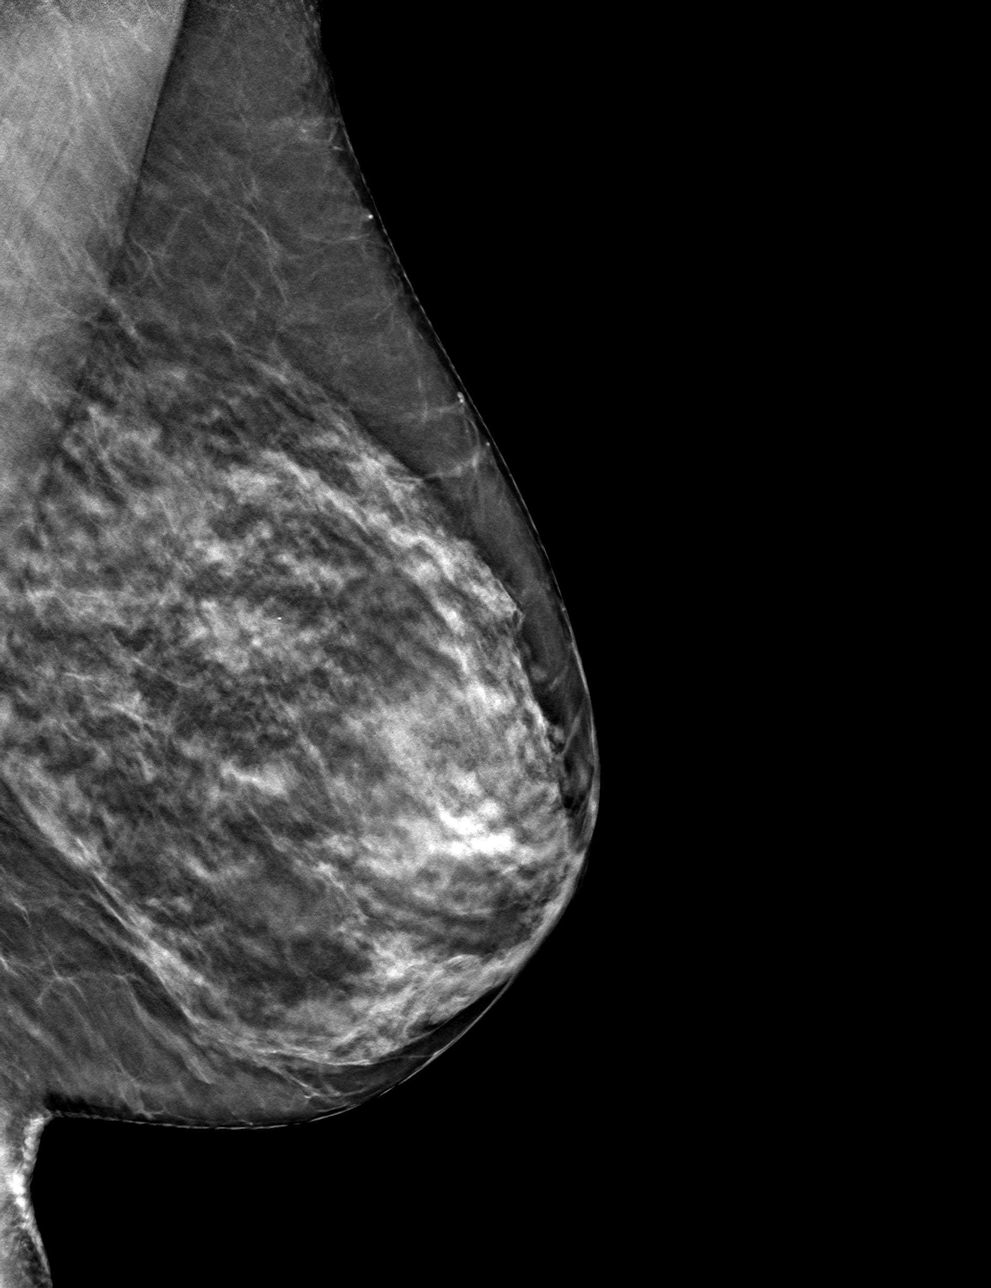

[L MLO tomo · tomo slice 26/51.0]
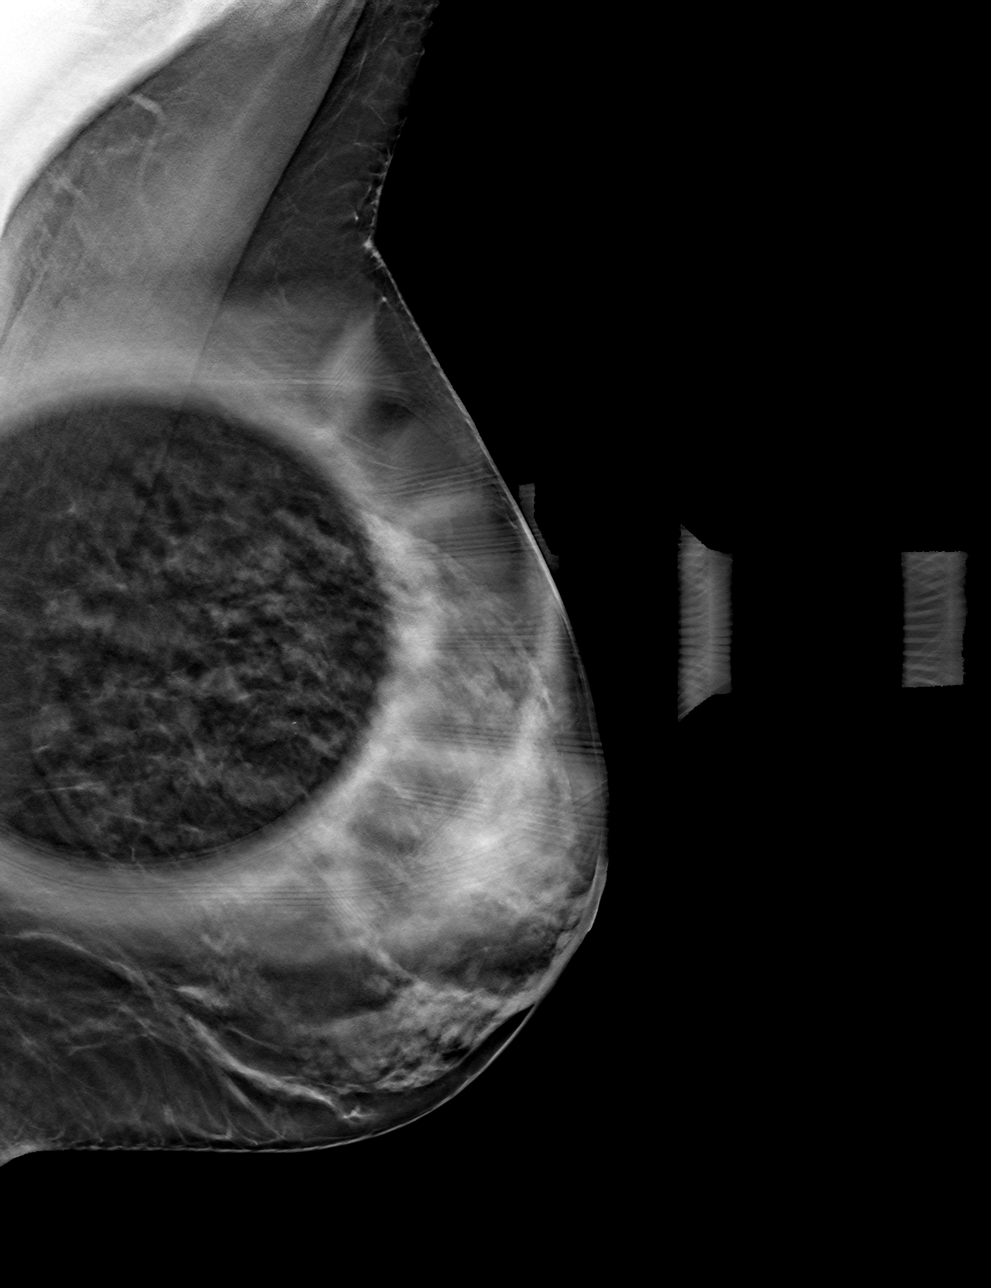

[4 of 12 positions shown; findings below may reference images not displayed]

ACR Breast Density Category c: The breast tissue is heterogeneously
dense, which may obscure small masses.
FINDINGS: Spot-compression MLO view of the area of concern in the full field
mediolateral view were obtained.

The asymmetry questioned on screening mammography disperses with
compression, indicating overlapping fibroglandular tissue. There is
no underlying mass or architectural distortion.

No findings suspicious for malignancy.
IMPRESSION: No mammographic evidence of malignancy involving the LEFT breast.

RECOMMENDATION:
Screening mammogram in one year.(Code:25-A-U0P)

I have discussed the findings and recommendations with the patient.
If applicable, a reminder letter will be sent to the patient
regarding the next appointment.

BI-RADS CATEGORY  1: Negative.

## 2023-12-12 DIAGNOSIS — H52223 Regular astigmatism, bilateral: Secondary | ICD-10-CM | POA: Diagnosis not present

## 2024-02-15 DIAGNOSIS — Z85828 Personal history of other malignant neoplasm of skin: Secondary | ICD-10-CM | POA: Diagnosis not present

## 2024-02-15 DIAGNOSIS — H61001 Unspecified perichondritis of right external ear: Secondary | ICD-10-CM | POA: Diagnosis not present

## 2024-02-15 DIAGNOSIS — I788 Other diseases of capillaries: Secondary | ICD-10-CM | POA: Diagnosis not present

## 2024-03-02 DIAGNOSIS — I10 Essential (primary) hypertension: Secondary | ICD-10-CM | POA: Diagnosis not present

## 2024-03-02 DIAGNOSIS — E7800 Pure hypercholesterolemia, unspecified: Secondary | ICD-10-CM | POA: Diagnosis not present

## 2024-03-06 DIAGNOSIS — K080 Exfoliation of teeth due to systemic causes: Secondary | ICD-10-CM | POA: Diagnosis not present

## 2024-04-01 DIAGNOSIS — I10 Essential (primary) hypertension: Secondary | ICD-10-CM | POA: Diagnosis not present

## 2024-04-01 DIAGNOSIS — E7800 Pure hypercholesterolemia, unspecified: Secondary | ICD-10-CM | POA: Diagnosis not present

## 2024-04-03 DIAGNOSIS — M19071 Primary osteoarthritis, right ankle and foot: Secondary | ICD-10-CM | POA: Diagnosis not present

## 2024-04-03 DIAGNOSIS — M25571 Pain in right ankle and joints of right foot: Secondary | ICD-10-CM | POA: Diagnosis not present

## 2024-04-22 DIAGNOSIS — M19071 Primary osteoarthritis, right ankle and foot: Secondary | ICD-10-CM | POA: Diagnosis not present

## 2024-04-22 DIAGNOSIS — M129 Arthropathy, unspecified: Secondary | ICD-10-CM | POA: Diagnosis not present

## 2024-04-22 DIAGNOSIS — M2141 Flat foot [pes planus] (acquired), right foot: Secondary | ICD-10-CM | POA: Diagnosis not present

## 2024-04-22 DIAGNOSIS — Z79899 Other long term (current) drug therapy: Secondary | ICD-10-CM | POA: Diagnosis not present

## 2024-05-02 DIAGNOSIS — I10 Essential (primary) hypertension: Secondary | ICD-10-CM | POA: Diagnosis not present

## 2024-05-02 DIAGNOSIS — E7800 Pure hypercholesterolemia, unspecified: Secondary | ICD-10-CM | POA: Diagnosis not present

## 2024-06-05 ENCOUNTER — Other Ambulatory Visit: Payer: Self-pay | Admitting: Internal Medicine

## 2024-06-05 DIAGNOSIS — I7121 Aneurysm of the ascending aorta, without rupture: Secondary | ICD-10-CM | POA: Diagnosis not present

## 2024-06-05 DIAGNOSIS — Z79899 Other long term (current) drug therapy: Secondary | ICD-10-CM | POA: Diagnosis not present

## 2024-06-05 DIAGNOSIS — E7800 Pure hypercholesterolemia, unspecified: Secondary | ICD-10-CM | POA: Diagnosis not present

## 2024-06-05 DIAGNOSIS — I10 Essential (primary) hypertension: Secondary | ICD-10-CM | POA: Diagnosis not present

## 2024-06-05 DIAGNOSIS — M8589 Other specified disorders of bone density and structure, multiple sites: Secondary | ICD-10-CM | POA: Diagnosis not present

## 2024-06-05 DIAGNOSIS — Z0000 Encounter for general adult medical examination without abnormal findings: Secondary | ICD-10-CM | POA: Diagnosis not present

## 2024-06-05 DIAGNOSIS — Z1331 Encounter for screening for depression: Secondary | ICD-10-CM | POA: Diagnosis not present

## 2024-06-10 ENCOUNTER — Ambulatory Visit
Admission: RE | Admit: 2024-06-10 | Discharge: 2024-06-10 | Disposition: A | Discharge status: Home / Self Care | Source: Ambulatory Visit | Attending: Internal Medicine | Admitting: Internal Medicine

## 2024-06-10 DIAGNOSIS — J9811 Atelectasis: Secondary | ICD-10-CM | POA: Diagnosis not present

## 2024-06-10 DIAGNOSIS — I712 Thoracic aortic aneurysm, without rupture, unspecified: Secondary | ICD-10-CM | POA: Diagnosis not present

## 2024-06-10 DIAGNOSIS — I7121 Aneurysm of the ascending aorta, without rupture: Secondary | ICD-10-CM

## 2024-06-10 MED ORDER — IOPAMIDOL (ISOVUE-370) INJECTION 76%
75.0000 mL | Freq: Once | INTRAVENOUS | Status: AC | PRN
  Administered 2024-06-10: 75 mL via INTRAVENOUS

## 2024-06-12 DIAGNOSIS — L821 Other seborrheic keratosis: Secondary | ICD-10-CM | POA: Diagnosis not present

## 2024-06-12 DIAGNOSIS — Z85828 Personal history of other malignant neoplasm of skin: Secondary | ICD-10-CM | POA: Diagnosis not present

## 2024-06-12 DIAGNOSIS — D225 Melanocytic nevi of trunk: Secondary | ICD-10-CM | POA: Diagnosis not present

## 2024-06-12 DIAGNOSIS — L905 Scar conditions and fibrosis of skin: Secondary | ICD-10-CM | POA: Diagnosis not present

## 2024-06-12 DIAGNOSIS — L820 Inflamed seborrheic keratosis: Secondary | ICD-10-CM | POA: Diagnosis not present

## 2024-06-21 DIAGNOSIS — H259 Unspecified age-related cataract: Secondary | ICD-10-CM | POA: Diagnosis not present

## 2024-07-15 DIAGNOSIS — M25571 Pain in right ankle and joints of right foot: Secondary | ICD-10-CM | POA: Diagnosis not present

## 2024-07-15 DIAGNOSIS — M19071 Primary osteoarthritis, right ankle and foot: Secondary | ICD-10-CM | POA: Diagnosis not present

## 2024-07-15 DIAGNOSIS — Q6651 Congenital pes planus, right foot: Secondary | ICD-10-CM | POA: Diagnosis not present

## 2024-07-15 DIAGNOSIS — M79671 Pain in right foot: Secondary | ICD-10-CM | POA: Diagnosis not present

## 2024-07-16 ENCOUNTER — Other Ambulatory Visit (HOSPITAL_BASED_OUTPATIENT_CLINIC_OR_DEPARTMENT_OTHER): Payer: Self-pay | Admitting: Obstetrics and Gynecology

## 2024-07-16 DIAGNOSIS — Z13820 Encounter for screening for osteoporosis: Secondary | ICD-10-CM

## 2024-07-16 DIAGNOSIS — H1045 Other chronic allergic conjunctivitis: Secondary | ICD-10-CM | POA: Diagnosis not present

## 2024-07-16 DIAGNOSIS — H0101A Ulcerative blepharitis right eye, upper and lower eyelids: Secondary | ICD-10-CM | POA: Diagnosis not present

## 2024-07-16 DIAGNOSIS — H0101B Ulcerative blepharitis left eye, upper and lower eyelids: Secondary | ICD-10-CM | POA: Diagnosis not present

## 2024-07-29 DIAGNOSIS — M19071 Primary osteoarthritis, right ankle and foot: Secondary | ICD-10-CM | POA: Diagnosis not present

## 2024-08-13 DIAGNOSIS — M24571 Contracture, right ankle: Secondary | ICD-10-CM | POA: Diagnosis not present

## 2024-08-13 DIAGNOSIS — M79671 Pain in right foot: Secondary | ICD-10-CM | POA: Diagnosis not present

## 2024-08-13 DIAGNOSIS — M2141 Flat foot [pes planus] (acquired), right foot: Secondary | ICD-10-CM | POA: Diagnosis not present

## 2024-08-13 DIAGNOSIS — M25871 Other specified joint disorders, right ankle and foot: Secondary | ICD-10-CM | POA: Diagnosis not present

## 2024-09-10 DIAGNOSIS — H0101B Ulcerative blepharitis left eye, upper and lower eyelids: Secondary | ICD-10-CM | POA: Diagnosis not present

## 2024-09-10 DIAGNOSIS — H0101A Ulcerative blepharitis right eye, upper and lower eyelids: Secondary | ICD-10-CM | POA: Diagnosis not present

## 2024-09-14 ENCOUNTER — Inpatient Hospital Stay (HOSPITAL_BASED_OUTPATIENT_CLINIC_OR_DEPARTMENT_OTHER)
Admission: RE | Admit: 2024-09-14 | Discharge: 2024-09-14 | Attending: Obstetrics and Gynecology | Admitting: Obstetrics and Gynecology

## 2024-09-14 DIAGNOSIS — M8589 Other specified disorders of bone density and structure, multiple sites: Secondary | ICD-10-CM | POA: Diagnosis not present

## 2024-09-14 DIAGNOSIS — Z13820 Encounter for screening for osteoporosis: Secondary | ICD-10-CM

## 2024-09-14 DIAGNOSIS — Z780 Asymptomatic menopausal state: Secondary | ICD-10-CM | POA: Diagnosis not present

## 2024-09-18 ENCOUNTER — Other Ambulatory Visit
